# Patient Record
Sex: Female | Born: 1950 | Race: White | Hispanic: No | Marital: Married | Smoking: Never smoker
Health system: Southern US, Community
[De-identification: ages and names within clinical notes are randomized; demographics above are authoritative.]

## PROBLEM LIST (undated history)

## (undated) DIAGNOSIS — N816 Rectocele: Secondary | ICD-10-CM

## (undated) DIAGNOSIS — E079 Disorder of thyroid, unspecified: Secondary | ICD-10-CM

## (undated) HISTORY — PX: PELVIC LAPAROSCOPY: SHX162

## (undated) HISTORY — PX: AUGMENTATION MAMMAPLASTY: SUR837

## (undated) HISTORY — DX: Disorder of thyroid, unspecified: E07.9

## (undated) HISTORY — PX: TUBAL LIGATION: SHX77

## (undated) HISTORY — PX: VAGINAL HYSTERECTOMY: SUR661

## (undated) HISTORY — DX: Rectocele: N81.6

## (undated) HISTORY — PX: SHOULDER SURGERY: SHX246

## (undated) HISTORY — PX: OOPHORECTOMY: SHX86

---

## 1998-03-27 ENCOUNTER — Other Ambulatory Visit: Admission: RE | Admit: 1998-03-27 | Discharge: 1998-03-27 | Payer: Self-pay | Admitting: Obstetrics and Gynecology

## 1999-07-27 ENCOUNTER — Ambulatory Visit (HOSPITAL_COMMUNITY): Admission: RE | Admit: 1999-07-27 | Discharge: 1999-07-27 | Payer: Self-pay | Admitting: Gastroenterology

## 1999-07-29 ENCOUNTER — Other Ambulatory Visit: Admission: RE | Admit: 1999-07-29 | Discharge: 1999-07-29 | Payer: Self-pay | Admitting: Obstetrics and Gynecology

## 2000-09-01 ENCOUNTER — Other Ambulatory Visit: Admission: RE | Admit: 2000-09-01 | Discharge: 2000-09-01 | Payer: Self-pay | Admitting: Obstetrics and Gynecology

## 2000-10-18 ENCOUNTER — Other Ambulatory Visit: Admission: RE | Admit: 2000-10-18 | Discharge: 2000-10-18 | Payer: Self-pay | Admitting: Obstetrics and Gynecology

## 2000-10-18 ENCOUNTER — Encounter (INDEPENDENT_AMBULATORY_CARE_PROVIDER_SITE_OTHER): Payer: Self-pay | Admitting: Specialist

## 2000-11-02 ENCOUNTER — Ambulatory Visit (HOSPITAL_COMMUNITY): Admission: RE | Admit: 2000-11-02 | Discharge: 2000-11-02 | Payer: Self-pay | Admitting: Neurosurgery

## 2000-11-02 ENCOUNTER — Encounter: Payer: Self-pay | Admitting: Neurosurgery

## 2001-03-06 ENCOUNTER — Inpatient Hospital Stay (HOSPITAL_COMMUNITY): Admission: RE | Admit: 2001-03-06 | Discharge: 2001-03-09 | Payer: Self-pay | Admitting: Obstetrics and Gynecology

## 2001-03-06 ENCOUNTER — Encounter (INDEPENDENT_AMBULATORY_CARE_PROVIDER_SITE_OTHER): Payer: Self-pay

## 2002-04-02 ENCOUNTER — Other Ambulatory Visit: Admission: RE | Admit: 2002-04-02 | Discharge: 2002-04-02 | Payer: Self-pay | Admitting: Obstetrics and Gynecology

## 2003-06-13 ENCOUNTER — Other Ambulatory Visit: Admission: RE | Admit: 2003-06-13 | Discharge: 2003-06-13 | Payer: Self-pay | Admitting: Obstetrics and Gynecology

## 2004-07-29 ENCOUNTER — Other Ambulatory Visit: Admission: RE | Admit: 2004-07-29 | Discharge: 2004-07-29 | Payer: Self-pay | Admitting: Obstetrics and Gynecology

## 2004-12-01 ENCOUNTER — Ambulatory Visit (HOSPITAL_BASED_OUTPATIENT_CLINIC_OR_DEPARTMENT_OTHER): Admission: RE | Admit: 2004-12-01 | Discharge: 2004-12-01 | Payer: Self-pay | Admitting: Orthopedic Surgery

## 2005-08-25 ENCOUNTER — Other Ambulatory Visit: Admission: RE | Admit: 2005-08-25 | Discharge: 2005-08-25 | Payer: Self-pay | Admitting: Obstetrics and Gynecology

## 2006-08-30 ENCOUNTER — Other Ambulatory Visit: Admission: RE | Admit: 2006-08-30 | Discharge: 2006-08-30 | Payer: Self-pay | Admitting: Obstetrics and Gynecology

## 2007-09-04 ENCOUNTER — Other Ambulatory Visit: Admission: RE | Admit: 2007-09-04 | Discharge: 2007-09-04 | Payer: Self-pay | Admitting: Obstetrics and Gynecology

## 2008-09-04 ENCOUNTER — Other Ambulatory Visit: Admission: RE | Admit: 2008-09-04 | Discharge: 2008-09-04 | Payer: Self-pay | Admitting: Obstetrics and Gynecology

## 2008-09-04 ENCOUNTER — Encounter: Payer: Self-pay | Admitting: Obstetrics and Gynecology

## 2008-09-04 ENCOUNTER — Ambulatory Visit: Payer: Self-pay | Admitting: Obstetrics and Gynecology

## 2009-06-25 ENCOUNTER — Emergency Department (HOSPITAL_COMMUNITY): Admission: EM | Admit: 2009-06-25 | Discharge: 2009-06-25 | Payer: Self-pay | Admitting: Emergency Medicine

## 2009-06-25 ENCOUNTER — Emergency Department (HOSPITAL_COMMUNITY): Admission: EM | Admit: 2009-06-25 | Discharge: 2009-06-25 | Payer: Self-pay | Admitting: Family Medicine

## 2009-11-25 ENCOUNTER — Ambulatory Visit: Payer: Self-pay | Admitting: Obstetrics and Gynecology

## 2009-11-25 ENCOUNTER — Other Ambulatory Visit: Admission: RE | Admit: 2009-11-25 | Discharge: 2009-11-25 | Payer: Self-pay | Admitting: Obstetrics and Gynecology

## 2010-11-27 ENCOUNTER — Other Ambulatory Visit
Admission: RE | Admit: 2010-11-27 | Discharge: 2010-11-27 | Payer: Self-pay | Source: Home / Self Care | Admitting: Obstetrics and Gynecology

## 2010-11-27 ENCOUNTER — Ambulatory Visit: Payer: Self-pay | Admitting: Obstetrics and Gynecology

## 2011-05-07 NOTE — Op Note (Signed)
[facility]  Patient:    Shawna Mitchell, Shawna Mitchell                      MRN: [id number] Proc. Date: 03/06/01 Attending:  Rande Brunt. Eda Paschal, M.D.                           Operative Report  PREOPERATIVE DIAGNOSES: 1. Dysfunctional uterine bleeding. 2. Leiomyomata uteri. 3. Rectocele. 4. Urinary stress incontinence.  POSTOPERATIVE DIAGNOSES: 1. Dysfunctional uterine bleeding. 2. Leiomyomata uteri. 3. Rectocele. 4. Urinary stress incontinence.  OPERATION: 1. Vaginal hysterectomy. 2. Bilateral salpingo-oophorectomy. 3. Pubovaginal sling. 4. Posterior repair.  SURGEONS:  Daniel L. Eda Paschal, M.D. and Excell Seltzer. Annabell Howells, M.D.  FIRST ASSISTANT:  Juan H. Lily Peer, M.D.  FINDINGS:  At the time of hysterectomy, the patients uterus was enlarged and boggy consistent with adenomyosis.  In addition, there were several myomas in the uterus.  Ovaries and fallopian tubes were free of any disease.  Pelvic peritoneum was free of any disease.  The patient had second degree rectocele.  DESCRIPTION OF PROCEDURE:  After adequate general endotracheal anesthesia, the patient was placed in the dorsolithotomy position and prepped and draped in the usual sterile manner.  A 1:200,000 solution of epinephrine and 0.5% Xylocaine was injected around the cervix.  A 360 degree incision was made around the cervix.  The bladder was mobilized superiorly as was the posterior peritoneum.  The posterior peritoneum and vesicouterine fold of peritoneum were entered by sharp dissection.  The uterosacral ligaments were clamped.  In clamping them, they were shortened, and then they were sutured to the vaginal cuff with good for good vault support.  Cardinal ligaments, uterine arteries, balance of the broad ligaments, utero-ovarian ligaments, round ligaments, and fallopian tubes were successively clamped, cut, and suture ligated.  All major vascular bundles were doubly ligated.  Number 1 chromic  catgut was utilized for all of the above pedicles.  The uterus was delivered and sent to pathology for tissue diagnosis.  During the procedure, in order to deliver the uterus, morcellation and myomectomies were done in order to reduce the size of the uterus to allow it to be removed.  It was, therefore, sent to pathology in pieces.  At this point, the adnexa were identified.  They were delivered into the incision.  The infundibulopelvic ligaments were clamped, cut, and doubly suture ligated with #1 chromic catgut, and both tubes and ovaries were also sent to pathology for tissue diagnosis.  The vaginal cuff was whip stitched to posterior peritoneum with running locking 0 Vicryl.  A modified McCall enterocele suture was placed to eliminate enterocele with 2-0 Vicryl  The peritoneum was then closed with a pursestring 0 Vicryl suture everting the above-mentioned pedicles.  Copious irrigation was done prior to closing the peritoneum.  Once again, two sponge, needle, and instrument counts were correct.  The vaginal cuff was then closed with figure-of-eight of 0 Vicryl.  At this point, Dr. Annabell Howells scrubbed into the procedure and did a pubovaginal sling which he dictated under a separate operative note.  When he was finished, a posterior repair was done by Dr. Eda Paschal.  This was accomplished by opening the posterior vaginal mucosa from the introitus all the way up to the cuff.  The perineum appeared to be well support, so therefore it was not altered in any fashion.  The perirectal fascia was sharply dissected from the mucosa as was the rectocele.  The  surgeon put his finger in the rectum to identify the rectocele even better and finished sharply dissecting it free from the vaginal mucosa.  Once this was done, the rectocele was reduced with interrupted sutures of 2-0 Vicryl incorporating perirectal fascia in order to reduce the rectocele.  Approximately 10 such sutures were utilized.   The posterior vaginal mucosa which had redundancy was trimmed away, and then the posterior vaginal mucosa was closed with a running 2-0 Vicryl.  As it was closed, the perirectal fascia was also picked up to eliminate the dead space.  Estimated blood loss for the entire procedure was 700 cc with none replaced. After the hysterectomy and prior to the pubovaginal sling, a Foley had been placed to empty the bladder to finish closing the vaginal cuff.  This had drained clear urine.  During the sling, indigo carmine was introduced, and both ureteral orifices were noted to be patent, and finally a suprapubic catheter which had been placed by Dr. Annabell Howells was noted to be draining clear urine at the termination of the procedure.  The vagina was then packed with 1 inch Iodoform, and the patient was taken from the operating room in satisfactory condition.DD:  03/06/01 TD:  03/07/01 Job: [id number]

## 2011-05-07 NOTE — Op Note (Signed)
Shawna Mitchell, Shawna Mitchell             ACCOUNT NO.:  [id number]   MEDICAL RECORD NO.:  [id number]          PATIENT TYPE:  AMB   LOCATION:  DSC                          FACILITY:  [facility]   PHYSICIAN:  Robert A. Thurston Hole, M.D. DATE OF BIRTH:  01/16/1951   DATE OF PROCEDURE:  12/01/2004  DATE OF DISCHARGE:                                 OPERATIVE REPORT   PREOPERATIVE DIAGNOSES:  1.  Left shoulder rotator cuff tendinitis with adhesive capsulitis.  2.  Left shoulder partial labrum tear.   POSTOPERATIVE DIAGNOSES:  1.  Left shoulder rotator cuff tendinitis with adhesive capsulitis.  2.  Left shoulder partial labrum tear.   OPERATION PERFORMED:  1.  Left shoulder examination under anesthesia followed by manipulation.  2.  Left shoulder arthroscopy with lysis of adhesions.  3.  Left shoulder arthroscopic partial labrum tear debridement.  4.  Left shoulder subacromial decompression.   SURGEON:  Elana Alm. Thurston Hole, M.D.   ASSISTANT:  Julien Girt, P.A.   ANESTHESIA:  General.   OPERATIVE TIME:  45 minutes.   COMPLICATIONS:  None.   INDICATIONS FOR PROCEDURE:  Shawna Mitchell is a 60 year old woman who has had  2-1/2 months of increased left shoulder pain with exam and MRI documenting  rotator cuff tendinitis with impingement and exam documenting adhesive  capsulitis who has failed conservative care and is now to undergo  examination under anesthesia, manipulation and arthroscopy.   DESCRIPTION OF PROCEDURE:  Shawna Mitchell was brought to the operating room  on December 01, 2004 after an interscalene block had been placed in the  holding room by anesthesia.  She was placed on the operating table in supine  position.  After being placed under general anesthesia, her left shoulder  was examined.  Initial range of motion showed forward flexion of 120,  abduction of 110, internal external rotation of 50 degrees.  A gentle  manipulation was carried out breaking up adhesions and improving  forward  flexion to 175, abduction to 175, internal external rotation of 90 degrees.  The shoulder remained stable to ligamentous exam.  She was then placed in a  beach chair position and her shoulder and arm were prepped using sterile  DuraPrep and draped using sterile technique.  Originally, through a  posterior arthroscopic portal, the arthroscope with a pump attached was  placed and through an anterior portal an arthroscopic probe was placed.  On  initial inspection, the articular cartilage in the glenohumeral joint was  found to be intact.  The anterior labrum showed partial tearing 25% which  was debrided.  Anterior inferior glenohumeral ligament complex was intact.  Superior labrum, biceps tendon anchor was intact.  The biceps tendon was  intact.  Posterior labrum was intact. Rotator cuff on the articular surface  showed no evidence of a tear.  There was diffuse erythematous synovitis  which was debrided and cauterized.  Inferior capsular recess free of  pathology.  Subacromial space was entered and a lateral arthroscopic portal  was made.  A large amount of bursitis was resected.  The rotator cuff was  inflamed and thickened on the bursal  surface but no evidence of a tear.  Impingement was noted and a subacromial decompression was carried out  removing 6 mm of the undersurface of the anterior, anterolateral and  anteromedial acromion, CA ligament release as well.  The [facility] joint was not  disturbed.  Shoulder could be brought through a full range of motion with no  impingement on the rotator cuff after this was done.  At this point it was  felt that all pathology had been satisfactorily addressed.  The instruments  were removed.  Portals were closed with 3-0 nylon suture.  Sterile dressings  and a sling applied and the patient awakened and taken to recovery room in  stable condition.   FOLLOW UP:  [name] will be followed as an outpatient on Percocet and  Naprosyn with early  aggressive physical therapy.  See her back in the office  in a week for suture removal and follow-up.       RAW/MEDQ  D:  12/01/2004  T:  12/01/2004  Job:  [id number]

## 2011-05-07 NOTE — Op Note (Signed)
[facility]  Patient:    Shawna Mitchell, Shawna Mitchell                MRN: [id number] Proc. Date: 03/06/01 Adm. Date:  [id number] Attending:  Sharon Mt CC:         Rande Brunt. Eda Paschal, M.D.   Operative Report  PREOPERATIVE DIAGNOSIS:  Type III stress incontinence.  POSTOPERATIVE DIAGNOSIS:  Type III stress incontinence.  OPERATION:  Pubovaginal sling.  SURGEON:  Excell Seltzer. Annabell Howells, M.D.  ANESTHESIA:  General.  DRAINS:  Suprapubic tube.  COMPLICATIONS:  None.  INDICATIONS:  [name] is a 60 year old white female who is to undergo hysterectomy and posterior repair.  During her preoperative evaluation, she was found to have type III stress incontinence with minimal bladder mobility. It was felt that a sling would be appropriate in conjunction with other procedures.  FINDINGS AND PROCEDURE:  She had been taken to the operating room, placed in lithotomy position, and prepped and draped by Dr. Eda Paschal who had completed a vaginal hysterectomy.  I then entered the procedure where I made a 3 cm transverse incision above the pubis in the midline.  The fat was spread to the fascia with a hemostat.  I then made a midline anterior vaginal incision over the bladder neck and proximal urethra.  The mucosa over the vagina was elevated off the pubourethral fascia on each side to the attachment to the pubis.  Strully scissors were used to pierce the fascia in the retropubic space.  A finger was then passed to ensure an adequate passage.  There was some venous bleeding particularly on the left side.  A single figure-of-eight 2-0 Vicryl stitch was placed in one venous channel to control some bleeding. Once the dissection had been performed, a 2 x 8 cm ______  strip, which had been prepared by fog and soaking in antibiotic solution, was then prepared by placing a #1 Prolene through each end with a quadruple helical stitch.  The suspension sutures were then passed  from the vaginal area to the abdominal incision on each side using two passes of the Raz sliding needle.  The needle was passed under finger guidance from the abdominal incision to the vaginal incision.  Once both of the suspension sutures had been passed, the sling was tacked proximally and distally in the midline using 2-0 Vicryl tacking sutures to ensure good position of the bladder neck and proximal urethra.  The anterior vaginal wall was then closed using a running locked 2-0 Vicryl.  At this point, cystoscopy was performed.  The patient had been given indigo carmine earlier in the procedure.  Inspection of the bladder revealed blue urine effluxing from both ureters freely.  No evidence of bladder injury was noted.  The bladder was then filled.  The patient was placed in Trendelenburg, and a microvasive Fader Tip suprapubic tube was placed through a separate stab wound superior to the abdominal incision.  This was done under cystoscopic guidance.  Once the tube was in good position, the trocar was removed.  The cord was secured, and the tube was placed to straight drainage.  At this point, the bladder was drained.  The cystoscope sheath was left in the urethra to provide a neutral position.  The suspension sutures were then tied over a hemostat to avoid excess tension.  Once they were tied, they were tied across the midline to each other once again avoiding any excess tension.  At this point, the cystoscope sheath was used  to make sure the stitches were done and neutral position was maintained.  The cystoscope sheath was then removed.  The anterior abdominal incision was then closed using a running intracuticular 4-0 Vicryl.  The suprapubic tube was secured in place with a 0 silk suture.  At this point, Dr. Eda Paschal returned to the procedure and performed the posterior repair which will be dictated separately.  There were no complications during my portion of the procedure. DD:   03/06/01 TD:  03/07/01 Job: [id number]

## 2011-05-07 NOTE — Discharge Summary (Signed)
[facility]  Patient:    Shawna Mitchell, Shawna Mitchell                MRN: [id number] Adm. Date:  [id number] Disc. Date: [id number] Attending:  Sharon Mt CC:         Excell Seltzer. Annabell Howells, M.D.   Discharge Summary  HISTORY OF PRESENT ILLNESS:  The patient is a 60 year old, G2, P2 who entered the hospital with severe dysfunctional uterine bleeding, symptomatic rectocele and urinary stress incontinence for definitive surgery.  HOSPITAL COURSE:  On the day of admission, she was taken to the operating room.  She underwent a vaginal hysterectomy with bilateral salpingo-oophorectomy, pubovaginal sling and posterior repair. Postoperatively, she had persistent nausea that did not allow her to tolerate p.o. at all.  Slowly, her diet was advanced as well as continuing to change pain medications and other maneuvers to try and eliminate the nausea.  She was continued on IVs and finally by postop day #3, was now able to take an oral diet in a high enough fashion to be discharged.  FOLLOWUP:  She will be followed as an outpatient both by Dr. Annabell Howells and Dr. Eda Paschal.  DISCHARGE MEDICATIONS: 1. Macrobid to cover her suprapubic catheter which was still in at discharge. 2. Percocet for pain relief. 3. Climara patch 0.5 mg one weekly for estrogen replacement therapy.  DIET:  Regular.  ACTIVITY:  Ambulatory.  LABORATORY DATA AND X-RAY FINDINGS:  Final pathology report revealed a uterus with 3.2 cm fibroid with benign ovary and tubes.  DISCHARGE DIAGNOSES: 1. Dysfunctional uterine bleeding. 2. Leiomyomata uteri. 3. Symptomatic rectocele. 4. Urinary stress incontinence.  PROCEDURE:  Vaginal hysterectomy with bilateral salpingo-oophorectomy and pubovaginal sling with posterior repair.  CONDITION ON DISCHARGE:  Improved.             DD:  03/09/01 TD:  03/09/01 Job: [id number]

## 2011-05-07 NOTE — H&P (Signed)
[facility]  Patient:    Shawna Mitchell, Shawna Mitchell                      MRN: [id number] Attending:  Rande Brunt. Eda Paschal, M.D.                         History and Physical  CHIEF COMPLAINT:  Menometrorrhagia and symptomatic rectocele.  HISTORY OF PRESENT ILLNESS:  The patient is a 60 year old, gravida 2, para 2, abortus 0, who has presented to the office with very heavy periods.  She only has a 14 day period-free cycle, and then its followed by a 5-7 day extremely heavy period with severe menorrhagia.  This is also associated with significant dysmenorrhea.  She has tried oral contraceptives on several occasions to try to regulate this, and she does very poorly with the oral contraceptives, does not feel good, has weight gain, and also really does not find that it has resolved the above.  Sonohysterogram was then performed because of the above, and she has no intracavitary lesions.  She does have an intramural myoma, and it appears that she has adenomyosis based on the echo pattern in the myometrium.  She also has a significant history of endometriosis.  She now enters the hospital for vaginal hysterectomy because of persistent menometrorrhagia nonresponsive to medical therapy, as noted above.  In addition, she would like to have her ovaries removed and would like me to remove them even if it requires laparoscopy or an incision to do so.  In addition, she is also having difficulty eliminating stool.  She has to put perineal pressure in order to get stool out and on pelvic examination, she has a significant rectocele that explains the above.  She therefore will also undergo posterior repair.  She is also having significant urinary stress incontinence, and she will undergo a sling procedure by Dr. Bjorn Pippin at the same time.  PAST MEDICAL HISTORY: 1. Two laparoscopies for endometriosis. 2. Tubal ligation.  PRESENT MEDICATIONS:  Motrin for cramps.  ALLERGIES:   CODEINE.  FAMILY HISTORY:  Parents were both hypertensive.  She has an uncle who has colon cancer.  SOCIAL HISTORY:  She is a nonsmoker, nondrinker.  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  REVIEW OF SYSTEMS:  HEENT:  Negative.  CARDIAC:  Negative. RESPIRATORY:  Negative.  GI:  Negative.  GU:  Negative. NEUROMUSCULAR:  Negative.  ENDOCRINE:  Negative.  PHYSICAL EXAMINATION:  GENERAL:  The patient is a well-developed, well-nourished female in no acute distress.  VITAL SIGNS:  Blood pressure is 116/70, pulse 80 and regular, respirations 16 and nonlabored.  She is afebrile.  HEENT:  All within normal limits.  NECK:  Supple, trachea midline, thyroid not enlarged.  LUNGS:  Clear to P&A.  HEART:  No thrills, heaves, or murmurs.  BREASTS:  She has had augmentation mammoplasty, but there are no masses present.  ABDOMEN:  Soft without guarding, rebound, or masses.  PELVIC:  External and vaginal is within normal limits.  Cervix is clean.  Pap smear shows no atypia.  Uterus is retroverted, is enlarged by small myoma to approximately 7-8 week size.  There is 1-1/2 degrees of uterine prolapse. Adnexa are palpably normal.  Perineum is normal, but patient does have a second-degree rectocele which can be seen even better on rectal exam which is otherwise negative.  ADMISSION IMPRESSION:  Persistent menometrorrhagia nonresponsive to medical therapy, probable adenomyosis  and/or endometriosis, leiomyomata uteri, symptomatic rectocele, urinary stress incontinence.  PLAN:  Vaginal hysterectomy, bilateral salpingo-oophorectomy, pubovaginal sling, and posterior repair. DD:  03/07/01 TD:  03/07/01 Job: [id number]

## 2011-10-07 ENCOUNTER — Encounter: Payer: Self-pay | Admitting: Obstetrics and Gynecology

## 2011-12-03 ENCOUNTER — Other Ambulatory Visit: Payer: Self-pay | Admitting: Obstetrics and Gynecology

## 2011-12-08 ENCOUNTER — Encounter: Payer: Self-pay | Admitting: Gynecology

## 2011-12-08 DIAGNOSIS — R32 Unspecified urinary incontinence: Secondary | ICD-10-CM | POA: Insufficient documentation

## 2011-12-08 DIAGNOSIS — N809 Endometriosis, unspecified: Secondary | ICD-10-CM | POA: Insufficient documentation

## 2011-12-08 DIAGNOSIS — N816 Rectocele: Secondary | ICD-10-CM | POA: Insufficient documentation

## 2011-12-16 ENCOUNTER — Encounter: Payer: Self-pay | Admitting: Obstetrics and Gynecology

## 2011-12-17 ENCOUNTER — Telehealth: Payer: Self-pay | Admitting: *Deleted

## 2011-12-17 MED ORDER — ESTRADIOL 0.05 MG/24HR TD PTWK: 1.0000 | MEDICATED_PATCH | TRANSDERMAL | Status: DC

## 2011-12-17 MED ORDER — LEVOTHYROXINE SODIUM 75 MCG PO TABS: 75.0000 ug | ORAL_TABLET | Freq: Every day | ORAL | Status: DC

## 2011-12-17 NOTE — Telephone Encounter (Signed)
PT CALLED AND HAS SCHEDULED HER ANNUAL ON Jan 09, 2011 PT WOULD LIKE REFILL ON SYTHROID AND CLIMARA PATCH 0.05 MG

## 2012-01-10 ENCOUNTER — Other Ambulatory Visit (HOSPITAL_COMMUNITY)
Admission: RE | Admit: 2012-01-10 | Discharge: 2012-01-10 | Disposition: A | Discharge status: Home / Self Care | Payer: PRIVATE HEALTH INSURANCE | Source: Ambulatory Visit | Attending: Obstetrics and Gynecology | Admitting: Obstetrics and Gynecology

## 2012-01-10 ENCOUNTER — Ambulatory Visit (INDEPENDENT_AMBULATORY_CARE_PROVIDER_SITE_OTHER): Payer: PRIVATE HEALTH INSURANCE | Admitting: Obstetrics and Gynecology

## 2012-01-10 ENCOUNTER — Encounter: Payer: Self-pay | Admitting: Obstetrics and Gynecology

## 2012-01-10 VITALS — BP 112/70 | Ht 66.0 in | Wt 147.0 lb

## 2012-01-10 DIAGNOSIS — Z01419 Encounter for gynecological examination (general) (routine) without abnormal findings: Secondary | ICD-10-CM

## 2012-01-10 DIAGNOSIS — Z780 Asymptomatic menopausal state: Secondary | ICD-10-CM

## 2012-01-10 DIAGNOSIS — N951 Menopausal and female climacteric states: Secondary | ICD-10-CM

## 2012-01-10 DIAGNOSIS — E039 Hypothyroidism, unspecified: Secondary | ICD-10-CM

## 2012-01-10 LAB — URINALYSIS W MICROSCOPIC + REFLEX CULTURE
Casts: NONE SEEN
Crystals: NONE SEEN
Glucose, UA: NEGATIVE mg/dL
Protein, ur: NEGATIVE mg/dL

## 2012-01-10 LAB — CBC WITH DIFFERENTIAL/PLATELET
Eosinophils Absolute: 0.1 10*3/uL (ref 0.0–0.7)
Eosinophils Relative: 1 % (ref 0–5)
HCT: 41.5 % (ref 36.0–46.0)
Hemoglobin: 13.9 g/dL (ref 12.0–15.0)
Lymphs Abs: 1.8 10*3/uL (ref 0.7–4.0)
MCH: 31 pg (ref 26.0–34.0)
MCV: 92.4 fL (ref 78.0–100.0)
Monocytes Absolute: 0.5 10*3/uL (ref 0.1–1.0)
Monocytes Relative: 8 % (ref 3–12)
RBC: 4.49 MIL/uL (ref 3.87–5.11)

## 2012-01-10 NOTE — Progress Notes (Signed)
Patient came to see me today for her annual GYN exam. She has noticed for the last 3 months that she has night sweats, sleep disturbance, and migraines which have not bothered her for years. She used to go to the headache clinic but has not been a long time. She is up-to-date on mammograms. She's had 2 normal bone densities. She is having no vaginal bleeding. She has no pelvic pain. She remains on the same dose of both estrogen and thyroid.  Physical examination: Shawna Mitchell present. HEENT within normal limits. Neck: Thyroid not large. No masses. Supraclavicular nodes: not enlarged. Breasts: Examined in both sitting midline position. No skin changes and no masses. Abdomen: Soft no guarding rebound or masses or hernia. Pelvic: External: Within normal limits. BUS: Within normal limits. Vaginal:within normal limits. Good estrogen effect. No evidence of cystocele rectocele or enterocele. Cervix and uterus surgically absent. Adnexa: No masses. Rectovaginal exam: Confirmatory and negative. Extremities: Within normal limits.  Assessment: New symptoms suggestive of an adequate hormone replacement. Hypothyroidism.  Plan: TSH and estradiol drawn. We will call patient with results. We will need to e- prescribed medication once we see lab.

## 2012-01-11 ENCOUNTER — Other Ambulatory Visit: Payer: Self-pay | Admitting: *Deleted

## 2012-01-11 LAB — ESTRADIOL: Estradiol: 64.5 pg/mL

## 2012-01-11 MED ORDER — ESTRADIOL 0.05 MG/24HR TD PTWK: 1.0000 | MEDICATED_PATCH | TRANSDERMAL | Status: DC

## 2012-01-11 MED ORDER — ESCITALOPRAM OXALATE 10 MG PO TABS: ORAL_TABLET | ORAL | Status: DC

## 2012-01-11 MED ORDER — LEVOTHYROXINE SODIUM 75 MCG PO TABS: 75.0000 ug | ORAL_TABLET | Freq: Every day | ORAL | Status: DC

## 2012-01-21 ENCOUNTER — Telehealth: Payer: Self-pay | Admitting: *Deleted

## 2012-01-21 MED ORDER — ESTRADIOL 0.075 MG/24HR TD PTWK: 1.0000 | MEDICATED_PATCH | TRANSDERMAL | Status: DC

## 2012-01-21 NOTE — Telephone Encounter (Signed)
Pt called and said pharmacy never got her climara 0.075 patch. Per result note on 01/10/12 pt should be taking 0.075 not 0.05. New rx sent to pharmacy.

## 2013-01-10 ENCOUNTER — Ambulatory Visit (INDEPENDENT_AMBULATORY_CARE_PROVIDER_SITE_OTHER): Payer: PRIVATE HEALTH INSURANCE | Admitting: Gynecology

## 2013-01-10 ENCOUNTER — Encounter: Payer: Self-pay | Admitting: Gynecology

## 2013-01-10 VITALS — BP 130/80 | Ht 66.0 in | Wt 148.0 lb

## 2013-01-10 DIAGNOSIS — F411 Generalized anxiety disorder: Secondary | ICD-10-CM

## 2013-01-10 DIAGNOSIS — Z01419 Encounter for gynecological examination (general) (routine) without abnormal findings: Secondary | ICD-10-CM

## 2013-01-10 DIAGNOSIS — Z23 Encounter for immunization: Secondary | ICD-10-CM

## 2013-01-10 DIAGNOSIS — F419 Anxiety disorder, unspecified: Secondary | ICD-10-CM

## 2013-01-10 DIAGNOSIS — N952 Postmenopausal atrophic vaginitis: Secondary | ICD-10-CM | POA: Insufficient documentation

## 2013-01-10 DIAGNOSIS — E039 Hypothyroidism, unspecified: Secondary | ICD-10-CM

## 2013-01-10 DIAGNOSIS — IMO0002 Reserved for concepts with insufficient information to code with codable children: Secondary | ICD-10-CM

## 2013-01-10 DIAGNOSIS — F329 Major depressive disorder, single episode, unspecified: Secondary | ICD-10-CM

## 2013-01-10 LAB — HEMOGLOBIN A1C
Hgb A1c MFr Bld: 5.6 % (ref <5.7)
Mean Plasma Glucose: 114 mg/dL (ref <117)

## 2013-01-10 LAB — CBC WITH DIFFERENTIAL/PLATELET
Basophils Absolute: 0.1 10*3/uL (ref 0.0–0.1)
HCT: 40.3 % (ref 36.0–46.0)
Lymphocytes Relative: 27 % (ref 12–46)
Monocytes Absolute: 0.4 10*3/uL (ref 0.1–1.0)
Neutro Abs: 2.8 10*3/uL (ref 1.7–7.7)
Neutrophils Relative %: 61 % (ref 43–77)
RDW: 13.5 % (ref 11.5–15.5)
WBC: 4.5 10*3/uL (ref 4.0–10.5)

## 2013-01-10 LAB — CHOLESTEROL, TOTAL: Cholesterol: 194 mg/dL (ref 0–200)

## 2013-01-10 MED ORDER — OSPEMIFENE 60 MG PO TABS: 60.0000 mg | ORAL_TABLET | Freq: Every day | ORAL | Status: DC

## 2013-01-10 MED ORDER — ESCITALOPRAM OXALATE 10 MG PO TABS: ORAL_TABLET | ORAL | Status: DC

## 2013-01-10 NOTE — Patient Instructions (Addendum)
Tetanus, Diphtheria, Pertussis (Tdap) Vaccine What You Need to Know WHY GET VACCINATED? Tetanus, diphtheria and pertussis can be very serious diseases, even for adolescents and adults. Tdap vaccine can protect us from these diseases. TETANUS (Lockjaw) causes painful muscle tightening and stiffness, usually all over the body.  It can lead to tightening of muscles in the head and neck so you can't open your mouth, swallow, or sometimes even breathe. Tetanus kills about 1 out of 5 people who are infected. DIPHTHERIA can cause a thick coating to form in the back of the throat.  It can lead to breathing problems, paralysis, heart failure, and death. PERTUSSIS (Whooping Cough) causes severe coughing spells, which can cause difficulty breathing, vomiting and disturbed sleep.  It can also lead to weight loss, incontinence, and rib fractures. Up to 2 in 100 adolescents and 5 in 100 adults with pertussis are hospitalized or have complications, which could include pneumonia and death. These diseases are caused by bacteria. Diphtheria and pertussis are spread from person to person through coughing or sneezing. Tetanus enters the body through cuts, scratches, or wounds. Before vaccines, the [place] saw as many as 200,000 cases a year of diphtheria and pertussis, and hundreds of cases of tetanus. Since vaccination began, tetanus and diphtheria have dropped by about 99% and pertussis by about 80%. TDAP VACCINE Tdap vaccine can protect adolescents and adults from tetanus, diphtheria, and pertussis. One dose of Tdap is routinely given at age 11 or 12. People who did not get Tdap at that age should get it as soon as possible. Tdap is especially important for health care professionals and anyone having close contact with a baby younger than 12 months. Pregnant women should get a dose of Tdap during every pregnancy, to protect the newborn from pertussis. Infants are most at risk for severe, life-threatening  complications from pertussis. A similar vaccine, called Td, protects from tetanus and diphtheria, but not pertussis. A Td booster should be given every 10 years. Tdap may be given as one of these boosters if you have not already gotten a dose. Tdap may also be given after a severe cut or burn to prevent tetanus infection. Your doctor can give you more information. Tdap may safely be given at the same time as other vaccines. SOME PEOPLE SHOULD NOT GET THIS VACCINE  If you ever had a life-threatening allergic reaction after a dose of any tetanus, diphtheria, or pertussis containing vaccine, OR if you have a severe allergy to any part of this vaccine, you should not get Tdap. Tell your doctor if you have any severe allergies.  If you had a coma, or long or multiple seizures within 7 days after a childhood dose of DTP or DTaP, you should not get Tdap, unless a cause other than the vaccine was found. You can still get Td.  Talk to your doctor if you:  have epilepsy or another nervous system problem,  had severe pain or swelling after any vaccine containing diphtheria, tetanus or pertussis,  ever had Guillain-Barr Syndrome (GBS),  aren't feeling well on the day the shot is scheduled. RISKS OF A VACCINE REACTION With any medicine, including vaccines, there is a chance of side effects. These are usually mild and go away on their own, but serious reactions are also possible. Brief fainting spells can follow a vaccination, leading to injuries from falling. Sitting or lying down for about 15 minutes can help prevent these. Tell your doctor if you feel dizzy or light-headed, or   have vision changes or ringing in the ears. Mild problems following Tdap (Did not interfere with activities)  Pain where the shot was given (about 3 in 4 adolescents or 2 in 3 adults)  Redness or swelling where the shot was given (about 1 person in 5)  Mild fever of at least 100.4F (up to about 1 in 25 adolescents or 1 in  100 adults)  Headache (about 3 or 4 people in 10)  Tiredness (about 1 person in 3 or 4)  Nausea, vomiting, diarrhea, stomach ache (up to 1 in 4 adolescents or 1 in 10 adults)  Chills, body aches, sore joints, rash, swollen glands (uncommon) Moderate problems following Tdap (Interfered with activities, but did not require medical attention)  Pain where the shot was given (about 1 in 5 adolescents or 1 in 100 adults)  Redness or swelling where the shot was given (up to about 1 in 16 adolescents or 1 in 25 adults)  Fever over 102F (about 1 in 100 adolescents or 1 in 250 adults)  Headache (about 3 in 20 adolescents or 1 in 10 adults)  Nausea, vomiting, diarrhea, stomach ache (up to 1 or 3 people in 100)  Swelling of the entire arm where the shot was given (up to about 3 in 100). Severe problems following Tdap (Unable to perform usual activities, required medical attention)  Swelling, severe pain, bleeding and redness in the arm where the shot was given (rare). A severe allergic reaction could occur after any vaccine (estimated less than 1 in a million doses). WHAT IF THERE IS A SERIOUS REACTION? What should I look for?  Look for anything that concerns you, such as signs of a severe allergic reaction, very high fever, or behavior changes. Signs of a severe allergic reaction can include hives, swelling of the face and throat, difficulty breathing, a fast heartbeat, dizziness, and weakness. These would start a few minutes to a few hours after the vaccination. What should I do?  If you think it is a severe allergic reaction or other emergency that can't wait, call 9-1-1 or get the person to the nearest hospital. Otherwise, call your doctor.  Afterward, the reaction should be reported to the "Vaccine Adverse Event Reporting System" (VAERS). Your doctor might file this report, or you can do it yourself through the VAERS web site at www.vaers.hhs.gov, or by calling 1-800-822-7967. VAERS is  only for reporting reactions. They do not give medical advice.  THE NATIONAL VACCINE INJURY COMPENSATION PROGRAM The National Vaccine Injury Compensation Program (VICP) is a federal program that was created to compensate people who may have been injured by certain vaccines. Persons who believe they may have been injured by a vaccine can learn about the program and about filing a claim by calling 1-800-338-2382 or visiting the VICP website at www.hrsa.gov/vaccinecompensation. HOW CAN I LEARN MORE?  Ask your doctor.  Call your local or state health department.  Contact the Centers for Disease Control and Prevention (CDC):  Call 1-800-232-4636 or visit CDC's website at www.cdc.gov/vaccines CDC Tdap Vaccine VIS (04/27/12) Document Released: 06/06/2012 Document Reviewed: 06/06/2012 ExitCare Patient Information 2013 [employer].  Shingles Vaccine What You Need to Know WHAT IS SHINGLES?  Shingles is a painful skin rash, often with blisters. It is also called Herpes Zoster or just Zoster.  A shingles rash usually appears on one side of the face or body and lasts from 2 to 4 weeks. Its main symptom is pain, which can be quite severe. Other symptoms of   shingles can include fever, headache, chills, and upset stomach. Very rarely, a shingles infection can lead to pneumonia, hearing problems, blindness, brain inflammation (encephalitis), or death.  For about 1 person in 5, severe pain can continue even after the rash clears up. This is called post-herpetic neuralgia.  Shingles is caused by the Varicella Zoster virus. This is the same virus that causes chickenpox. Only someone who has had a case of chickenpox or rarely, has gotten chickenpox vaccine, can get shingles. The virus stays in your body. It can reappear many years later to cause a case of shingles.  You cannot catch shingles from another person with shingles. However, a person who has never had chickenpox (or chickenpox vaccine) could get  chickenpox from someone with shingles. This is not very common.  Shingles is far more common in people 50 and older than in younger people. It is also more common in people whose immune systems are weakened because of a disease such as cancer or drugs such as steroids or chemotherapy.  At least 1 million people get shingles per year in the [place]. SHINGLES VACCINE  A vaccine for shingles was licensed in 2006. In clinical trials, the vaccine reduced the risk of shingles by 50%. It can also reduce the pain in people who still get shingles after being vaccinated.  A single dose of shingles vaccine is recommended for adults 60 years of age and older. SOME PEOPLE SHOULD NOT GET SHINGLES VACCINE OR SHOULD WAIT A person should not get shingles vaccine if he or she:  Has ever had a life-threatening allergic reaction to gelatin, the antibiotic neomycin, or any other component of shingles vaccine. Tell your caregiver if you have any severe allergies.  Has a weakened immune system because of current:  AIDS or another disease that affects the immune system.  Treatment with drugs that affect the immune system, such as prolonged use of high-dose steroids.  Cancer treatment, such as radiation or chemotherapy.  Cancer affecting the bone marrow or lymphatic system, such as leukemia or lymphoma.  Is pregnant, or might be pregnant. Women should not become pregnant until at least 4 weeks after getting shingles vaccine. Someone with a minor illness, such as a cold, may be vaccinated. Anyone with a moderate or severe acute illness should usually wait until he or she recovers before getting the vaccine. This includes anyone with a temperature of 101.3 F (38 C) or higher. WHAT ARE THE RISKS FROM SHINGLES VACCINE?  A vaccine, like any medicine, could possibly cause serious problems, such as severe allergic reactions. However, the risk of a vaccine causing serious harm, or death, is extremely  small.  No serious problems have been identified with shingles vaccine. Mild Problems  Redness, soreness, swelling, or itching at the site of the injection (about 1 person in 3).  Headache (about 1 person in 70). Like all vaccines, shingles vaccine is being closely monitored for unusual or severe problems. WHAT IF THERE IS A MODERATE OR SEVERE REACTION? What should I look for? Any unusual condition, such as a severe allergic reaction or a high fever. If a severe allergic reaction occurred, it would be within a few minutes to an hour after the shot. Signs of a serious allergic reaction can include difficulty breathing, weakness, hoarseness or wheezing, a fast heartbeat, hives, dizziness, paleness, or swelling of the throat. What should I do?  Call your caregiver, or get the person to a caregiver right away.  Tell the caregiver what happened,   the date and time it happened, and when the vaccination was given.  Ask the caregiver to report the reaction by filing a Vaccine Adverse Event Reporting System (VAERS) form. Or, you can file this report through the VAERS web site at www.vaers.hhs.gov or by calling 1-800-822-7967. VAERS does not provide medical advice. HOW CAN I LEARN MORE?  Ask your caregiver. He or she can give you the vaccine package insert or suggest other sources of information.  Contact the Centers for Disease Control and Prevention (CDC):  Call 1-800-232-4636 (1-800-CDC-INFO).  Visit the CDC website at www.cdc.gov/vaccines CDC Shingles Vaccine VIS (09/24/08) Document Released: 10/03/2006 Document Revised: 02/28/2012 Document Reviewed: 09/24/2008 ExitCare Patient Information 2013 [employer].   

## 2013-01-10 NOTE — Progress Notes (Signed)
Shawna Mitchell Feb 03, 1951 [id number]   History:    62 y.o.  for annual gyn exam who has a history of hypothyroidism as well as past history of depression and anxiety. Also review of her record indicated that she has been on ERT consisting of Climara 0.07 04/24/2001 when patient had a TVH BSO sling procedure. Her main concern is vaginal dryness which she's off ERT. Patient's last mammogram was in favor 2013 schedule for one this year. Her last bone density study done at another facility 4 years ago was normal. Her last colonoscopy was normal in 2012. She has an uncle had colon cancer. She's currently taking calcium and vitamin D for osteoporosis prevention. She rarely sees her primary care physician at [facility].  Past medical history,surgical history, family history and social history were all reviewed and documented in the EPIC chart.  Gynecologic History No LMP recorded. Patient has had a hysterectomy. Contraception: status post hysterectomy Last Pap: 2013. Results were: normal Last mammogram: 2013. Results were: normal  Obstetric History OB History    Grav Para Term Preterm Abortions TAB SAB Ect Mult Living   2 2 2      1 3      # Outc Date GA Lbr Len/2nd Wgt Sex Del Anes PTL Lv   1 TRM            2 TRM                ROS: A ROS was performed and pertinent positives and negatives are included in the history.  GENERAL: No fevers or chills. HEENT: No change in vision, no earache, sore throat or sinus congestion. NECK: No pain or stiffness. CARDIOVASCULAR: No chest pain or pressure. No palpitations. PULMONARY: No shortness of breath, cough or wheeze. GASTROINTESTINAL: No abdominal pain, nausea, vomiting or diarrhea, melena or bright red blood per rectum. GENITOURINARY: No urinary frequency, urgency, hesitancy or dysuria. MUSCULOSKELETAL: No joint or muscle pain, no back pain, no recent trauma. DERMATOLOGIC: No rash, no itching, no lesions. ENDOCRINE: No polyuria, polydipsia,  no heat or cold intolerance. No recent change in weight. HEMATOLOGICAL: No anemia or easy bruising or bleeding. NEUROLOGIC: No headache, seizures, numbness, tingling or weakness. PSYCHIATRIC: No depression, no loss of interest in normal activity or change in sleep pattern.     Exam: chaperone present  BP 130/80  Ht 5\' 6"  (1.676 m)  Wt 148 lb (67.132 kg)  BMI 23.89 kg/m2  Body mass index is 23.89 kg/(m^2).  General appearance : Well developed well nourished female. No acute distress HEENT: Neck supple, trachea midline, no carotid bruits, no thyroidmegaly Lungs: Clear to auscultation, no rhonchi or wheezes, or rib retractions  Heart: Regular rate and rhythm, no murmurs or gallops Breast:Examined in sitting and supine position were symmetrical in appearance, no palpable masses or tenderness,  no skin retraction, no nipple inversion, no nipple discharge, no skin discoloration, no axillary or supraclavicular lymphadenopathy Abdomen: no palpable masses or tenderness, no rebound or guarding Extremities: no edema or skin discoloration or tenderness  Pelvic:  Bartholin, Urethra, Skene Glands: Within normal limits             Vagina: No gross lesions or discharge  Cervix: Absent  Uterus absent  Adnexa  Without masses or tenderness  Anus and perineum  normal   Rectovaginal  normal sphincter tone without palpated masses or tenderness             Hemoccult cards provided   Patient states  that she's had her flu vaccine several months ago. She does not recall ever having received a Tdap vaccine. She has not had her shingles vaccine yet.  Assessment/Plan:  62 y.o. female for annual exam who was counseled and received a Tdap vaccine today. Prescription was provided for her to obtain her shingles vaccine. She was reminded to continue to do her monthly self breast examination and to followup with her mammogram next month. Hemoccult cards were provided for her to submit to the office for testing. We  discussed the new Pap smear screening guidelines. Patient with no past history of abnormal Pap smears. Patient will no longer needs Pap smears since she has had a hysterectomy. She will be schedule her bone density study in the next few months. We discussed importance of calcium and vitamin D and regular exercise for osteoporosis prevention. She was given prescription refill her Lexapro 10 mg which she takes daily as a mood stabilizer her depression and anxiety. Also prescription refill was provided for her Synthroid 75 mcg daily. We discussed today the women's health initiative study and the recommendation to taper off her estrogen replacement therapy since she has been on it since 2002. We discussed for her vaginal atrophy and dyspareunia the new product Osphena for which the risk benefits and pros and cons were discussed. We'll start her on 60 mg daily once she comes off the estrogen. Literature information was provided. The following labs were today: CBC, screening cholesterol, hemoglobin A1c, TSH and urinalysis.    Ok Edwards MD, 9:06 AM 01/10/2013

## 2013-01-11 LAB — URINALYSIS W MICROSCOPIC + REFLEX CULTURE
Bacteria, UA: NONE SEEN
Hgb urine dipstick: NEGATIVE
Nitrite: NEGATIVE
Protein, ur: NEGATIVE mg/dL
Urobilinogen, UA: 0.2 mg/dL (ref 0.0–1.0)
pH: 5 (ref 5.0–8.0)

## 2013-01-12 LAB — URINE CULTURE: Colony Count: 25000

## 2013-02-17 ENCOUNTER — Other Ambulatory Visit: Payer: Self-pay | Admitting: Women's Health

## 2013-04-03 ENCOUNTER — Telehealth: Payer: Self-pay | Admitting: *Deleted

## 2013-04-03 MED ORDER — ESTRADIOL 0.075 MG/24HR TD PTWK: 1.0000 | MEDICATED_PATCH | TRANSDERMAL | Status: DC

## 2013-04-03 NOTE — Telephone Encounter (Signed)
Pt informed with the below note, rx sent. 

## 2013-04-03 NOTE — Telephone Encounter (Signed)
Pt called stating she had to stop taking the osphena 60 mg due to severe leg cramps, the leg cramps did stop once off medication. But she does have very bad hot flashes day and night very much worse at bedtime to the point it wakes her out of her sleep, pt is getting only about 3 hours per night of sleep. She asked if Rx could be giving for hot flashes. Please advise

## 2013-04-03 NOTE — Telephone Encounter (Signed)
Week and call her in the Climara 0.075 weekly transdermal patch and that she was on in the past. Prescribe  4  with 11 refills

## 2013-04-03 NOTE — Telephone Encounter (Signed)
Left message for pt to call.

## 2013-08-25 ENCOUNTER — Ambulatory Visit (INDEPENDENT_AMBULATORY_CARE_PROVIDER_SITE_OTHER): Payer: PRIVATE HEALTH INSURANCE | Admitting: Family Medicine

## 2013-08-25 VITALS — BP 118/70 | HR 72 | Temp 98.2°F | Resp 16 | Ht 66.5 in | Wt 142.0 lb

## 2013-08-25 DIAGNOSIS — G43909 Migraine, unspecified, not intractable, without status migrainosus: Secondary | ICD-10-CM

## 2013-08-25 MED ORDER — KETOROLAC TROMETHAMINE 60 MG/2ML IM SOLN
60.0000 mg | Freq: Once | INTRAMUSCULAR | Status: AC
  Administered 2013-08-25: 60 mg via INTRAMUSCULAR

## 2013-08-25 MED ORDER — BUTALBITAL-APAP-CAFFEINE 50-325-40 MG PO TABS: 1.0000 | ORAL_TABLET | Freq: Four times a day (QID) | ORAL | Status: AC | PRN

## 2013-08-25 MED ORDER — PROMETHAZINE HCL 25 MG/ML IJ SOLN
25.0000 mg | Freq: Once | INTRAMUSCULAR | Status: AC
  Administered 2013-08-25: 25 mg via INTRAMUSCULAR

## 2013-08-25 NOTE — Patient Instructions (Signed)
Migraine Headache A migraine headache is an intense, throbbing pain on one or both sides of your head. A migraine can last for 30 minutes to several hours. CAUSES  The exact cause of a migraine headache is not always known. However, a migraine may be caused when nerves in the brain become irritated and release chemicals that cause inflammation. This causes pain. SYMPTOMS  Pain on one or both sides of your head.  Pulsating or throbbing pain.  Severe pain that prevents daily activities.  Pain that is aggravated by any physical activity.  Nausea, vomiting, or both.  Dizziness.  Pain with exposure to bright lights, loud noises, or activity.  General sensitivity to bright lights, loud noises, or smells. Before you get a migraine, you may get warning signs that a migraine is coming (aura). An aura may include:  Seeing flashing lights.  Seeing bright spots, halos, or zig-zag lines.  Having tunnel vision or blurred vision.  Having feelings of numbness or tingling.  Having trouble talking.  Having muscle weakness. MIGRAINE TRIGGERS  Alcohol.  Smoking.  Stress.  Menstruation.  Aged cheeses.  Foods or drinks that contain nitrates, glutamate, aspartame, or tyramine.  Lack of sleep.  Chocolate.  Caffeine.  Hunger.  Physical exertion.  Fatigue.  Medicines used to treat chest pain (nitroglycerine), birth control pills, estrogen, and some blood pressure medicines. DIAGNOSIS  A migraine headache is often diagnosed based on:  Symptoms.  Physical examination.  A CT scan or MRI of your head. TREATMENT Medicines may be given for pain and nausea. Medicines can also be given to help prevent recurrent migraines.  HOME CARE INSTRUCTIONS  Only take over-the-counter or prescription medicines for pain or discomfort as directed by your caregiver. The use of long-term narcotics is not recommended.  Lie down in a dark, quiet room when you have a migraine.  Keep a journal  to find out what may trigger your migraine headaches. For example, write down:  What you eat and drink.  How much sleep you get.  Any change to your diet or medicines.  Limit alcohol consumption.  Quit smoking if you smoke.  Get 7 to 9 hours of sleep, or as recommended by your caregiver.  Limit stress.  Keep lights dim if bright lights bother you and make your migraines worse. SEEK IMMEDIATE MEDICAL CARE IF:   Your migraine becomes severe.  You have a fever.  You have a stiff neck.  You have vision loss.  You have muscular weakness or loss of muscle control.  You start losing your balance or have trouble walking.  You feel faint or pass out.  You have severe symptoms that are different from your first symptoms. MAKE SURE YOU:   Understand these instructions.  Will watch your condition.  Will get help right away if you are not doing well or get worse. Document Released: 12/06/2005 Document Revised: 02/28/2012 Document Reviewed: 11/26/2011 Exit[employer] Patient Information 2014 [employer].  

## 2013-08-25 NOTE — Progress Notes (Signed)
Subjective:    Patient ID: Shawna Mitchell, female    DOB: 1951-12-17, 63 y.o.   MRN: [id number] Chief Complaint  Patient presents with  . Migraine    since 2 am today; nausea, vomiting, light and sound sensitivity    HPI  Has long h/o migraines but has not had severe migraine since she has become post-menopausal - in over 10 yrs.  Occ has mild migraines. If she takes her excedrin migraine it usually knocks it out.  Her right forehead is painful throbbing and radiating back.  No vision changes.  NO sinus pressure, no watering eyes or nose. No dizziness. No f/c. Severe photophobia. Last took excedrin migraine about 8 hrs ago with no relief. Has just gotten worse and is worried she isn't going to be able to sleep tonight. This is the worst one she has had in a very long time but is NOT the worst HA of her life.  Has never needed to use triptans prior.  Past Medical History  Diagnosis Date  . Endometriosis   . Rectocele   . Urinary incontinence     USI  . Thyroid disease     Hypothyroid   Current Outpatient Prescriptions on File Prior to Visit  Medication Sig Dispense Refill  . Calcium Carbonate-Vitamin D (CALCIUM + D PO) Take by mouth.        . estradiol (CLIMARA - DOSED IN MG/24 HR) 0.075 mg/24hr Place 1 patch (0.075 mg total) onto the skin once a week.  4 patch  11  . levothyroxine (SYNTHROID, LEVOTHROID) 75 MCG tablet TAKE 1 TABLET BY MOUTH EVERY DAY  30 tablet  12  . escitalopram (LEXAPRO) 10 MG tablet Take 10 mg by mouth daily.        Marland Kitchen escitalopram (LEXAPRO) 10 MG tablet Take one every other day  30 tablet  6  . Ospemifene (OSPHENA) 60 MG TABS Take 60 mg by mouth daily.  30 tablet  11   No current facility-administered medications on file prior to visit.   Allergies  Allergen Reactions  . Codeine      Review of Systems  Constitutional: Positive for fatigue. Negative for fever, chills, diaphoresis, appetite change and unexpected weight change.  HENT: Negative for ear  pain, congestion, rhinorrhea, neck pain, neck stiffness, dental problem, sinus pressure and tinnitus.   Eyes: Positive for photophobia. Negative for pain, discharge and visual disturbance.  Gastrointestinal: Negative for nausea and vomiting.  Skin: Negative for rash.  Neurological: Positive for headaches. Negative for dizziness, tremors, seizures, syncope, facial asymmetry, speech difficulty, weakness, light-headedness and numbness.  Psychiatric/Behavioral: Negative for sleep disturbance.      BP 118/70  Pulse 72  Temp(Src) 98.2 F (36.8 C) (Oral)  Resp 16  Ht 5' 6.5" (1.689 m)  Wt 142 lb (64.411 kg)  BMI 22.58 kg/m2  SpO2 98%  Objective:   Physical Exam  Constitutional: She is oriented to person, place, and time. She appears well-developed and well-nourished. She appears listless.  Laying on exam table in dark room, all lights off, crying.  HENT:  Head: Normocephalic and atraumatic.  Right Ear: Tympanic membrane, external ear and ear canal normal.  Left Ear: Tympanic membrane, external ear and ear canal normal.  Nose: Nose normal. Right sinus exhibits no maxillary sinus tenderness. Left sinus exhibits no maxillary sinus tenderness.  Mouth/Throat: Uvula is midline, oropharynx is clear and moist and mucous membranes are normal. No oropharyngeal exudate.  Eyes: Conjunctivae and EOM are normal. Pupils are  equal, round, and reactive to light.  Neck: Normal range of motion. Neck supple. No thyromegaly present.  Cardiovascular: Normal rate, regular rhythm, normal heart sounds and intact distal pulses.   Pulmonary/Chest: Effort normal and breath sounds normal. No respiratory distress.  Neurological: She is oriented to person, place, and time. She has normal strength. She appears listless. No cranial nerve deficit or sensory deficit. Coordination and gait normal.  Skin: Skin is warm and dry. She is not diaphoretic.  Psychiatric: She has a normal mood and affect. Her behavior is normal.       Assessment & Plan:  Migraine - Plan: ketorolac (TORADOL) injection 60 mg, promethazine (PHENERGAN) injection 25 mg If recurs or becomes more freq, RTC to discuss. If HA persists or worsens - to ER. Meds ordered this encounter  Medications  . ketorolac (TORADOL) injection 60 mg    Sig:   . promethazine (PHENERGAN) injection 25 mg    Sig:   . butalbital-acetaminophen-caffeine (FIORICET) 50-325-40 MG per tablet    Sig: Take 1-2 tablets by mouth every 6 (six) hours as needed for headache.    Dispense:  10 tablet    Refill:  0

## 2013-10-11 ENCOUNTER — Encounter: Payer: Self-pay | Admitting: Gynecology

## 2014-01-20 HISTORY — PX: SHOULDER OPEN ROTATOR CUFF REPAIR: SHX2407

## 2014-02-03 ENCOUNTER — Emergency Department (HOSPITAL_BASED_OUTPATIENT_CLINIC_OR_DEPARTMENT_OTHER)
Admission: EM | Admit: 2014-02-03 | Discharge: 2014-02-03 | Disposition: A | Discharge status: Home / Self Care | Payer: PRIVATE HEALTH INSURANCE | Attending: Emergency Medicine | Admitting: Emergency Medicine

## 2014-02-03 ENCOUNTER — Encounter (HOSPITAL_BASED_OUTPATIENT_CLINIC_OR_DEPARTMENT_OTHER): Payer: Self-pay | Admitting: Emergency Medicine

## 2014-02-03 ENCOUNTER — Emergency Department (HOSPITAL_BASED_OUTPATIENT_CLINIC_OR_DEPARTMENT_OTHER): Payer: PRIVATE HEALTH INSURANCE

## 2014-02-03 DIAGNOSIS — S6000XA Contusion of unspecified finger without damage to nail, initial encounter: Secondary | ICD-10-CM | POA: Insufficient documentation

## 2014-02-03 DIAGNOSIS — Y939 Activity, unspecified: Secondary | ICD-10-CM | POA: Insufficient documentation

## 2014-02-03 DIAGNOSIS — Z79899 Other long term (current) drug therapy: Secondary | ICD-10-CM | POA: Insufficient documentation

## 2014-02-03 DIAGNOSIS — S60112A Contusion of left thumb with damage to nail, initial encounter: Secondary | ICD-10-CM

## 2014-02-03 DIAGNOSIS — Y929 Unspecified place or not applicable: Secondary | ICD-10-CM | POA: Insufficient documentation

## 2014-02-03 DIAGNOSIS — T148XXA Other injury of unspecified body region, initial encounter: Secondary | ICD-10-CM

## 2014-02-03 DIAGNOSIS — X58XXXA Exposure to other specified factors, initial encounter: Secondary | ICD-10-CM | POA: Insufficient documentation

## 2014-02-03 DIAGNOSIS — E039 Hypothyroidism, unspecified: Secondary | ICD-10-CM | POA: Insufficient documentation

## 2014-02-03 DIAGNOSIS — Z8742 Personal history of other diseases of the female genital tract: Secondary | ICD-10-CM | POA: Insufficient documentation

## 2014-02-03 MED ORDER — OXYCODONE-ACETAMINOPHEN 5-325 MG PO TABS
1.0000 | ORAL_TABLET | Freq: Once | ORAL | Status: AC
  Administered 2014-02-03: 1 via ORAL
  Filled 2014-02-03: qty 1

## 2014-02-03 MED ORDER — DOXYCYCLINE HYCLATE 100 MG PO CAPS: 100.0000 mg | ORAL_CAPSULE | Freq: Two times a day (BID) | ORAL | Status: DC

## 2014-02-03 MED ORDER — PROMETHAZINE HCL 25 MG PO TABS
12.5000 mg | ORAL_TABLET | Freq: Once | ORAL | Status: AC
  Administered 2014-02-03: 12.5 mg via ORAL

## 2014-02-03 MED ORDER — DOXYCYCLINE HYCLATE 100 MG PO TABS
ORAL_TABLET | ORAL | Status: AC
  Filled 2014-02-03: qty 1

## 2014-02-03 MED ORDER — DOXYCYCLINE HYCLATE 100 MG PO TABS
100.0000 mg | ORAL_TABLET | Freq: Once | ORAL | Status: AC
  Administered 2014-02-03: 100 mg via ORAL

## 2014-02-03 MED ORDER — PROMETHAZINE HCL 25 MG PO TABS
ORAL_TABLET | ORAL | Status: AC
  Filled 2014-02-03: qty 1

## 2014-02-03 NOTE — ED Provider Notes (Signed)
CSN: [id number]     Arrival date & time 02/03/14  0008 History  This chart was scribed for Alim Cattell Smitty Cords, MD by Danella Maiers, ED Scribe. This patient was seen in room MH09/MH09 and the patient's care was started at 12:25 AM.    Chief Complaint  Patient presents with  . Nail Problem   Patient is a 63 y.o. female presenting with extremity pain. The history is provided by the patient. No language interpreter was used.  Extremity Pain This is a new problem. The current episode started yesterday. The problem occurs constantly. The problem has not changed since onset.Pertinent negatives include no abdominal pain. Nothing aggravates the symptoms. Nothing relieves the symptoms. Treatments tried: dilaudid. The treatment provided no relief.  Patient denies trauma thought she has a scab on the thumb.   HPI Comments: Shawna Mitchell is a 63 y.o. female who presents to the Emergency Department complaining of gradually-worsening, constant pain, swelling, and discoloration to the left thumb nail onset when she woke up yesterday morning. Pt denies injury or trauma.   Past Medical History  Diagnosis Date  . Endometriosis   . Rectocele   . Urinary incontinence     USI  . Thyroid disease     Hypothyroid   Past Surgical History  Procedure Laterality Date  . Pelvic laparoscopy      Tubal lavage-laser vap of endometriosis  . Tubal ligation    . Vaginal hysterectomy      BSO-sling-Posterior repair  . Oophorectomy      BSO  . Shoulder surgery      Right   Family History  Problem Relation Age of Onset  . Hypertension Mother   . Hypertension Father   . Cancer Maternal Uncle     Colon cancer  . Breast cancer Paternal Grandmother    History  Substance Use Topics  . Smoking status: Never Smoker   . Smokeless tobacco: Never Used  . Alcohol Use: 0.5 oz/week    1 drink(s) per week   OB History   Grav Para Term Preterm Abortions TAB SAB Ect Mult Living   2 2 2      1 3      Review  of Systems  Gastrointestinal: Negative for abdominal pain.  All other systems reviewed and are negative.      Allergies  Codeine  Home Medications   Current Outpatient Rx  Name  Route  Sig  Dispense  Refill  . butalbital-acetaminophen-caffeine (FIORICET) 50-325-40 MG per tablet   Oral   Take 1-2 tablets by mouth every 6 (six) hours as needed for headache.   10 tablet   0   . Calcium Carbonate-Vitamin D (CALCIUM + D PO)   Oral   Take by mouth.           . escitalopram (LEXAPRO) 10 MG tablet   Oral   Take 10 mg by mouth daily.           Marland Kitchen escitalopram (LEXAPRO) 10 MG tablet      Take one every other day   30 tablet   6   . estradiol (CLIMARA - DOSED IN MG/24 HR) 0.075 mg/24hr   Transdermal   Place 1 patch (0.075 mg total) onto the skin once a week.   4 patch   11   . levothyroxine (SYNTHROID, LEVOTHROID) 75 MCG tablet      TAKE 1 TABLET BY MOUTH EVERY DAY   30 tablet   12   .  Ospemifene (OSPHENA) 60 MG TABS   Oral   Take 60 mg by mouth daily.   30 tablet   11    BP 152/74  Pulse 94  Temp(Src) 97.6 F (36.4 C) (Oral)  Resp 22  Ht 5\' 8"  (1.727 m)  Wt 140 lb (63.504 kg)  BMI 21.29 kg/m2  SpO2 96% Physical Exam  Nursing note and vitals reviewed. Constitutional: She is oriented to person, place, and time. She appears well-developed and well-nourished. No distress.  HENT:  Head: Normocephalic and atraumatic.  Eyes: EOM are normal. Pupils are equal, round, and reactive to light.  Neck: Normal range of motion. Neck supple. No tracheal deviation present.  Cardiovascular: Normal rate.   Pulmonary/Chest: Effort normal. No respiratory distress.  Abdominal: Soft. Bowel sounds are normal.  Musculoskeletal: Normal range of motion.  Hematoma of the left thumb tip lateral to the nail and with ~20% nail involvement. No paronychia.  Scab  Neurological: She is alert and oriented to person, place, and time.  Skin: Skin is warm and dry.  Psychiatric: She has a  normal mood and affect. Her behavior is normal.    ED Course  Procedures (including critical care time) Medications - No data to display  DIAGNOSTIC STUDIES: Oxygen Saturation is 96% on RA, normal by my interpretation.    COORDINATION OF CARE: 12:33 AM- Discussed treatment plan with pt. Pt agrees to plan.    Labs Review Labs Reviewed - No data to display Imaging Review No results found.  EKG Interpretation   None       MDM   Final diagnoses:  None    20% subungual hematoma, finger tip hematoma.  Will not ID tissue hematoma.  No paronychia.  Scab on finger. Patient on oxycodone, dilaudid and celebrex.  Ice Elevation and follow up with hand for recheck  I personally performed the services described in this documentation, which was scribed in my presence. The recorded information has been reviewed and is accurate.    Jasmine AweApril K Cristle Jared-Rasch, MD 02/03/14 [phone]

## 2014-02-03 NOTE — ED Notes (Signed)
Swelling, discoloration to left thumb nail.  Onset yesterday, no known injury or causative factor.

## 2014-03-18 ENCOUNTER — Other Ambulatory Visit: Payer: Self-pay | Admitting: Women's Health

## 2014-03-20 ENCOUNTER — Other Ambulatory Visit: Payer: Self-pay

## 2014-03-20 MED ORDER — LEVOTHYROXINE SODIUM 75 MCG PO TABS: ORAL_TABLET | ORAL | Status: DC

## 2014-03-20 MED ORDER — ESTRADIOL 0.075 MG/24HR TD PTWK: 0.0750 mg | MEDICATED_PATCH | TRANSDERMAL | Status: DC

## 2014-03-20 NOTE — Telephone Encounter (Signed)
Patient called to explain that she was behind getting her annual exam as she has had some major surgery.  She has appt scheduled for 04/10/14 for her annual. Meantime, she is requesting refills on her Synthroid and generic Climara patch.  I refilled these each one time and pt informed.

## 2014-04-10 ENCOUNTER — Encounter: Payer: Self-pay | Admitting: Gynecology

## 2014-04-10 ENCOUNTER — Ambulatory Visit (INDEPENDENT_AMBULATORY_CARE_PROVIDER_SITE_OTHER): Payer: PRIVATE HEALTH INSURANCE | Admitting: Gynecology

## 2014-04-10 VITALS — BP 122/80 | Ht 66.0 in | Wt 149.0 lb

## 2014-04-10 DIAGNOSIS — Z1159 Encounter for screening for other viral diseases: Secondary | ICD-10-CM

## 2014-04-10 DIAGNOSIS — N952 Postmenopausal atrophic vaginitis: Secondary | ICD-10-CM

## 2014-04-10 DIAGNOSIS — M79609 Pain in unspecified limb: Secondary | ICD-10-CM

## 2014-04-10 DIAGNOSIS — E039 Hypothyroidism, unspecified: Secondary | ICD-10-CM

## 2014-04-10 DIAGNOSIS — Z01419 Encounter for gynecological examination (general) (routine) without abnormal findings: Secondary | ICD-10-CM

## 2014-04-10 DIAGNOSIS — R252 Cramp and spasm: Secondary | ICD-10-CM

## 2014-04-10 MED ORDER — LEVOTHYROXINE SODIUM 75 MCG PO TABS: ORAL_TABLET | ORAL | Status: DC

## 2014-04-10 MED ORDER — ESTRADIOL 0.05 MG/24HR TD PTWK: 0.0500 mg | MEDICATED_PATCH | TRANSDERMAL | Status: AC

## 2014-04-10 NOTE — Patient Instructions (Signed)
Shingles Vaccine  What You Need to Know  WHAT IS SHINGLES?  · Shingles is a painful skin rash, often with blisters. It is also called Herpes Zoster or just Zoster.  · A shingles rash usually appears on one side of the face or body and lasts from 2 to 4 weeks. Its main symptom is pain, which can be quite severe. Other symptoms of shingles can include fever, headache, chills, and upset stomach. Very rarely, a shingles infection can lead to pneumonia, hearing problems, blindness, brain inflammation (encephalitis), or death.  · For about 1 person in 5, severe pain can continue even after the rash clears up. This is called post-herpetic neuralgia.  · Shingles is caused by the Varicella Zoster virus. This is the same virus that causes chickenpox. Only someone who has had a case of chickenpox or rarely, has gotten chickenpox vaccine, can get shingles. The virus stays in your body. It can reappear many years later to cause a case of shingles.  · You cannot catch shingles from another person with shingles. However, a person who has never had chickenpox (or chickenpox vaccine) could get chickenpox from someone with shingles. This is not very common.  · Shingles is far more common in people 50 and older than in younger people. It is also more common in people whose immune systems are weakened because of a disease such as cancer or drugs such as steroids or chemotherapy.  · At least 1 million people get shingles per year in the [place].  SHINGLES VACCINE  · A vaccine for shingles was licensed in 2006. In clinical trials, the vaccine reduced the risk of shingles by 50%. It can also reduce the pain in people who still get shingles after being vaccinated.  · A single dose of shingles vaccine is recommended for adults 60 years of age and older.  SOME PEOPLE SHOULD NOT GET SHINGLES VACCINE OR SHOULD WAIT  A person should not get shingles vaccine if he or she:  · Has ever had a life-threatening allergic reaction to gelatin, the  antibiotic neomycin, or any other component of shingles vaccine. Tell your caregiver if you have any severe allergies.  · Has a weakened immune system because of current:  · AIDS or another disease that affects the immune system.  · Treatment with drugs that affect the immune system, such as prolonged use of high-dose steroids.  · Cancer treatment, such as radiation or chemotherapy.  · Cancer affecting the bone marrow or lymphatic system, such as leukemia or lymphoma.  · Is pregnant, or might be pregnant. Women should not become pregnant until at least 4 weeks after getting shingles vaccine.  Someone with a minor illness, such as a cold, may be vaccinated. Anyone with a moderate or severe acute illness should usually wait until he or she recovers before getting the vaccine. This includes anyone with a temperature of 101.3° F (38° C) or higher.  WHAT ARE THE RISKS FROM SHINGLES VACCINE?  · A vaccine, like any medicine, could possibly cause serious problems, such as severe allergic reactions. However, the risk of a vaccine causing serious harm, or death, is extremely small.  · No serious problems have been identified with shingles vaccine.  Mild Problems  · Redness, soreness, swelling, or itching at the site of the injection (about 1 person in 3).  · Headache (about 1 person in 70).  Like all vaccines, shingles vaccine is being closely monitored for unusual or severe problems.  WHAT IF   THERE IS A MODERATE OR SEVERE REACTION?  What should I look for?  Any unusual condition, such as a severe allergic reaction or a high fever. If a severe allergic reaction occurred, it would be within a few minutes to an hour after the shot. Signs of a serious allergic reaction can include difficulty breathing, weakness, hoarseness or wheezing, a fast heartbeat, hives, dizziness, paleness, or swelling of the throat.  What should I do?  · Call your caregiver, or get the person to a caregiver right away.  · Tell the caregiver what  happened, the date and time it happened, and when the vaccination was given.  · Ask the caregiver to report the reaction by filing a Vaccine Adverse Event Reporting System (VAERS) form. Or, you can file this report through the VAERS web site at www.vaers.hhs.gov or by calling 1-800-822-7967.  VAERS does not provide medical advice.  HOW CAN I LEARN MORE?  · Ask your caregiver. He or she can give you the vaccine package insert or suggest other sources of information.  · Contact the Centers for Disease Control and Prevention (CDC):  · Call 1-800-232-4636 (1-800-CDC-INFO).  · Visit the CDC website at www.cdc.gov/vaccines  CDC Shingles Vaccine VIS (09/24/08)  Document Released: 10/03/2006 Document Revised: 02/28/2012 Document Reviewed: 03/28/2013  [employer]® Patient Information ©2014 [employer].

## 2014-04-10 NOTE — Progress Notes (Signed)
Nolon LennertLinda Diane Hipolito 04-14-1951 [id number]   History:    63 y.o.  for annual gyn exam who last year was placed on osphena for vaginal atrophy but she took for 6 weeks and discontinued because of leg cramps. She is on Climara 0.075 weekly which she had been started on a 2002 after [facility] BSO and sling procedure. Patient's last bone density study was in 2010 at another facility report indicated that she had normal bone density. Her last colonoscopy was in 2012 which is reported to be normal. Patient had an uncle with colon cancer. She is currently taking her calcium and vitamin D for osteoporosis prevention. She was seeing a primary care physician at [facility] but has not seen them and ovary year. Patient with history of hypothyroidism doing well on Synthroid 75 mcg daily.  Past medical history,surgical history, family history and social history were all reviewed and documented in the EPIC chart.  Gynecologic History No LMP recorded. Patient has had a hysterectomy. Contraception: status post hysterectomy Last Pap: 2013. Results were: normal Last mammogram: 2014. Results were: Normal, dense breast and saline implants had 3D  Obstetric History OB History  Gravida Para Term Preterm AB SAB TAB Ectopic Multiple Living  2 2 2      1 3     # Outcome Date GA Lbr Len/2nd Weight Sex Delivery Anes PTL Lv  2 TRM           1 TRM                ROS: A ROS was performed and pertinent positives and negatives are included in the history.  GENERAL: No fevers or chills. HEENT: No change in vision, no earache, sore throat or sinus congestion. NECK: No pain or stiffness. CARDIOVASCULAR: No chest pain or pressure. No palpitations. PULMONARY: No shortness of breath, cough or wheeze. GASTROINTESTINAL: No abdominal pain, nausea, vomiting or diarrhea, melena or bright red blood per rectum. GENITOURINARY: No urinary frequency, urgency, hesitancy or dysuria. MUSCULOSKELETAL: No joint or muscle pain, no  back pain, no recent trauma. DERMATOLOGIC: No rash, no itching, no lesions. ENDOCRINE: No polyuria, polydipsia, no heat or cold intolerance. No recent change in weight. HEMATOLOGICAL: No anemia or easy bruising or bleeding. NEUROLOGIC: No headache, seizures, numbness, tingling or weakness. PSYCHIATRIC: No depression, no loss of interest in normal activity or change in sleep pattern.     Exam: chaperone present  BP 122/80  Ht 5\' 6"  (1.676 m)  Wt 149 lb (67.586 kg)  BMI 24.06 kg/m2  Body mass index is 24.06 kg/(m^2).  General appearance : Well developed well nourished female. No acute distress HEENT: Neck supple, trachea midline, no carotid bruits, no thyroidmegaly Lungs: Clear to auscultation, no rhonchi or wheezes, or rib retractions  Heart: Regular rate and rhythm, no murmurs or gallops Breast:Examined in sitting and supine position were symmetrical in appearance, no palpable masses or tenderness,  no skin retraction, no nipple inversion, no nipple discharge, no skin discoloration, no axillary or supraclavicular lymphadenopathy Abdomen: no palpable masses or tenderness, no rebound or guarding Extremities: no edema or skin discoloration or tenderness  Pelvic:  Bartholin, Urethra, Skene Glands: Within normal limits             Vagina: No gross lesions or discharge, vaginal atrophy  Cervix: Absent  Uterus  Absent  Adnexa  Without masses or tenderness  Anus and perineum  normal   Rectovaginal  normal sphincter tone without palpated masses or tenderness  Hemoccult cards provided     Assessment/Plan:  63 y.o. female for annual exam who complains of vaginal dryness despite being on transdermal estrogen. We are going to decrease her transdermal estrogen Vivelle-Dot to 0.05 twice a week and she will use vaginal estradiol twice a week to help with her vaginal atrophy. The risks benefits and pros and cons were discussed. Patient will return back later this week for her fasting blood  work which would include the following: CBC, fasting lipid profile, comprehensive metabolic panel, TSH and urinalysis. She was provided with fecal Hemoccult cards to submit to the office for testing. The new Pap smear screening guidelines were discussed. She was not on the course of calcium vitamin D and regular exercise for osteoporosis prevention. We discussed importance of monthly breast exams.  New CDC guidelines is recommending patients be tested once in her lifetime for hepatitis C antibody who were born between [birth year] through 1964. This was discussed with the patient today and has agreed to be tested today.  Note: This dictation was prepared with  Dragon/digital dictation along withSmart phrase technology. Any transcriptional errors that result from this process are unintentional.   Ok EdwardsJuan H Fernandez MD, 10:48 AM 04/10/2014   [facility]

## 2014-05-28 ENCOUNTER — Other Ambulatory Visit: Payer: PRIVATE HEALTH INSURANCE

## 2014-05-28 DIAGNOSIS — Z01419 Encounter for gynecological examination (general) (routine) without abnormal findings: Secondary | ICD-10-CM

## 2014-05-28 DIAGNOSIS — Z1159 Encounter for screening for other viral diseases: Secondary | ICD-10-CM

## 2014-05-28 LAB — LIPID PANEL
CHOL/HDL RATIO: 2.3 ratio
CHOLESTEROL: 175 mg/dL (ref 0–200)
HDL: 75 mg/dL (ref >39)
LDL Cholesterol: 88 mg/dL (ref 0–99)
Triglycerides: 59 mg/dL (ref <150)
VLDL: 12 mg/dL (ref 0–40)

## 2014-05-28 LAB — COMPREHENSIVE METABOLIC PANEL
ALT: 10 U/L (ref 0–35)
AST: 16 U/L (ref 0–37)
Albumin: 4.4 g/dL (ref 3.5–5.2)
Alkaline Phosphatase: 41 U/L (ref 39–117)
BUN: 22 mg/dL (ref 6–23)
CALCIUM: 9.3 mg/dL (ref 8.4–10.5)
CHLORIDE: 104 meq/L (ref 96–112)
CO2: 25 meq/L (ref 19–32)
CREATININE: 0.97 mg/dL (ref 0.50–1.10)
Glucose, Bld: 82 mg/dL (ref 70–99)
POTASSIUM: 4.6 meq/L (ref 3.5–5.3)
Sodium: 139 mEq/L (ref 135–145)
Total Bilirubin: 0.7 mg/dL (ref 0.2–1.2)
Total Protein: 7.1 g/dL (ref 6.0–8.3)

## 2014-05-28 LAB — CBC WITH DIFFERENTIAL/PLATELET
Basophils Absolute: 0.1 10*3/uL (ref 0.0–0.1)
Basophils Relative: 2 % — ABNORMAL HIGH (ref 0–1)
EOS PCT: 4 % (ref 0–5)
Eosinophils Absolute: 0.1 10*3/uL (ref 0.0–0.7)
HEMATOCRIT: 39.8 % (ref 36.0–46.0)
Hemoglobin: 13.7 g/dL (ref 12.0–15.0)
LYMPHS ABS: 1.3 10*3/uL (ref 0.7–4.0)
LYMPHS PCT: 36 % (ref 12–46)
MCH: 30.7 pg (ref 26.0–34.0)
MCHC: 34.4 g/dL (ref 30.0–36.0)
MCV: 89.2 fL (ref 78.0–100.0)
MONO ABS: 0.3 10*3/uL (ref 0.1–1.0)
Monocytes Relative: 9 % (ref 3–12)
NEUTROS ABS: 1.8 10*3/uL (ref 1.7–7.7)
Neutrophils Relative %: 49 % (ref 43–77)
PLATELETS: 236 10*3/uL (ref 150–400)
RBC: 4.46 MIL/uL (ref 3.87–5.11)
RDW: 12.9 % (ref 11.5–15.5)
WBC: 3.7 10*3/uL — AB (ref 4.0–10.5)

## 2014-05-28 LAB — TSH: TSH: 3.162 u[IU]/mL (ref 0.350–4.500)

## 2014-05-28 LAB — HEPATITIS C ANTIBODY: HCV AB: NEGATIVE

## 2014-10-21 ENCOUNTER — Encounter: Payer: Self-pay | Admitting: Gynecology

## 2015-03-10 ENCOUNTER — Other Ambulatory Visit: Payer: Self-pay | Admitting: Physician Assistant

## 2015-04-16 ENCOUNTER — Other Ambulatory Visit: Payer: Self-pay | Admitting: Gynecology

## 2016-07-01 ENCOUNTER — Other Ambulatory Visit: Payer: Self-pay | Admitting: Obstetrics and Gynecology

## 2016-07-01 DIAGNOSIS — N6310 Unspecified lump in the right breast, unspecified quadrant: Secondary | ICD-10-CM

## 2016-07-06 ENCOUNTER — Ambulatory Visit
Admission: RE | Admit: 2016-07-06 | Discharge: 2016-07-06 | Disposition: A | Discharge status: Home / Self Care | Payer: PRIVATE HEALTH INSURANCE | Source: Ambulatory Visit | Attending: Obstetrics and Gynecology | Admitting: Obstetrics and Gynecology

## 2016-07-06 DIAGNOSIS — N6310 Unspecified lump in the right breast, unspecified quadrant: Secondary | ICD-10-CM

## 2016-07-13 ENCOUNTER — Other Ambulatory Visit: Payer: Self-pay | Admitting: Obstetrics and Gynecology

## 2016-07-13 DIAGNOSIS — N6323 Unspecified lump in the left breast, lower outer quadrant: Secondary | ICD-10-CM

## 2016-07-21 ENCOUNTER — Ambulatory Visit
Admission: RE | Admit: 2016-07-21 | Discharge: 2016-07-21 | Disposition: A | Discharge status: Home / Self Care | Payer: PRIVATE HEALTH INSURANCE | Source: Ambulatory Visit | Attending: Obstetrics and Gynecology | Admitting: Obstetrics and Gynecology

## 2016-07-21 DIAGNOSIS — N6323 Unspecified lump in the left breast, lower outer quadrant: Secondary | ICD-10-CM

## 2016-07-21 MED ORDER — GADOBENATE DIMEGLUMINE 529 MG/ML IV SOLN
13.0000 mL | Freq: Once | INTRAVENOUS | Status: AC | PRN
  Administered 2016-07-21: 13 mL via INTRAVENOUS

## 2017-05-04 ENCOUNTER — Encounter: Payer: Self-pay | Admitting: Gynecology

## 2017-07-07 ENCOUNTER — Other Ambulatory Visit: Payer: Self-pay | Admitting: Obstetrics and Gynecology

## 2017-07-07 DIAGNOSIS — Z9882 Breast implant status: Secondary | ICD-10-CM

## 2017-07-07 DIAGNOSIS — N63 Unspecified lump in unspecified breast: Secondary | ICD-10-CM

## 2017-07-08 ENCOUNTER — Ambulatory Visit
Admission: RE | Admit: 2017-07-08 | Discharge: 2017-07-08 | Disposition: A | Discharge status: Home / Self Care | Payer: Medicare Other | Source: Ambulatory Visit | Attending: Obstetrics and Gynecology | Admitting: Obstetrics and Gynecology

## 2017-07-08 DIAGNOSIS — Z9882 Breast implant status: Secondary | ICD-10-CM

## 2017-07-08 DIAGNOSIS — N63 Unspecified lump in unspecified breast: Secondary | ICD-10-CM

## 2017-07-08 MED ORDER — GADOBENATE DIMEGLUMINE 529 MG/ML IV SOLN
15.0000 mL | Freq: Once | INTRAVENOUS | Status: AC | PRN
  Administered 2017-07-08: 15 mL via INTRAVENOUS

## 2018-07-21 ENCOUNTER — Other Ambulatory Visit: Payer: Self-pay | Admitting: Obstetrics and Gynecology

## 2018-07-24 ENCOUNTER — Other Ambulatory Visit: Payer: Self-pay | Admitting: Obstetrics and Gynecology

## 2018-07-24 DIAGNOSIS — R921 Mammographic calcification found on diagnostic imaging of breast: Secondary | ICD-10-CM

## 2018-07-27 ENCOUNTER — Ambulatory Visit
Admission: RE | Admit: 2018-07-27 | Discharge: 2018-07-27 | Disposition: A | Discharge status: Home / Self Care | Payer: Medicare Other | Source: Ambulatory Visit | Attending: Obstetrics and Gynecology | Admitting: Obstetrics and Gynecology

## 2018-07-27 ENCOUNTER — Ambulatory Visit: Payer: Self-pay

## 2018-07-27 ENCOUNTER — Other Ambulatory Visit: Payer: Self-pay | Admitting: Obstetrics and Gynecology

## 2018-07-27 DIAGNOSIS — R921 Mammographic calcification found on diagnostic imaging of breast: Secondary | ICD-10-CM

## 2018-07-27 DIAGNOSIS — Z1231 Encounter for screening mammogram for malignant neoplasm of breast: Secondary | ICD-10-CM

## 2018-12-27 ENCOUNTER — Other Ambulatory Visit: Payer: Self-pay | Admitting: Plastic Surgery

## 2020-01-08 ENCOUNTER — Other Ambulatory Visit: Payer: Self-pay | Admitting: Obstetrics and Gynecology

## 2020-01-08 DIAGNOSIS — R928 Other abnormal and inconclusive findings on diagnostic imaging of breast: Secondary | ICD-10-CM

## 2020-01-16 ENCOUNTER — Ambulatory Visit
Admission: RE | Admit: 2020-01-16 | Discharge: 2020-01-16 | Disposition: A | Discharge status: Home / Self Care | Payer: Medicare Other | Source: Ambulatory Visit | Attending: Obstetrics and Gynecology | Admitting: Obstetrics and Gynecology

## 2020-01-16 ENCOUNTER — Other Ambulatory Visit: Payer: Self-pay

## 2020-01-16 ENCOUNTER — Other Ambulatory Visit: Payer: Self-pay | Admitting: Obstetrics and Gynecology

## 2020-01-16 DIAGNOSIS — R928 Other abnormal and inconclusive findings on diagnostic imaging of breast: Secondary | ICD-10-CM

## 2020-01-16 DIAGNOSIS — N6310 Unspecified lump in the right breast, unspecified quadrant: Secondary | ICD-10-CM

## 2020-07-16 ENCOUNTER — Other Ambulatory Visit: Payer: Self-pay | Admitting: Obstetrics and Gynecology

## 2020-07-16 ENCOUNTER — Ambulatory Visit
Admission: RE | Admit: 2020-07-16 | Discharge: 2020-07-16 | Disposition: A | Discharge status: Home / Self Care | Payer: Medicare Other | Source: Ambulatory Visit | Attending: Obstetrics and Gynecology | Admitting: Obstetrics and Gynecology

## 2020-07-16 ENCOUNTER — Other Ambulatory Visit: Payer: Self-pay

## 2020-07-16 DIAGNOSIS — N6310 Unspecified lump in the right breast, unspecified quadrant: Secondary | ICD-10-CM

## 2020-10-01 LAB — COLOGUARD: COLOGUARD: NEGATIVE

## 2021-01-19 ENCOUNTER — Ambulatory Visit
Admission: RE | Admit: 2021-01-19 | Discharge: 2021-01-19 | Disposition: A | Discharge status: Home / Self Care | Payer: Medicare Other | Source: Ambulatory Visit | Attending: Obstetrics and Gynecology | Admitting: Obstetrics and Gynecology

## 2021-01-19 ENCOUNTER — Other Ambulatory Visit: Payer: Self-pay

## 2021-01-19 ENCOUNTER — Other Ambulatory Visit: Payer: Self-pay | Admitting: Obstetrics and Gynecology

## 2021-01-19 DIAGNOSIS — N6310 Unspecified lump in the right breast, unspecified quadrant: Secondary | ICD-10-CM

## 2021-12-15 ENCOUNTER — Other Ambulatory Visit: Payer: Self-pay | Admitting: Obstetrics and Gynecology

## 2021-12-15 DIAGNOSIS — N6310 Unspecified lump in the right breast, unspecified quadrant: Secondary | ICD-10-CM

## 2022-01-20 ENCOUNTER — Other Ambulatory Visit: Payer: Self-pay | Admitting: Obstetrics and Gynecology

## 2022-01-20 ENCOUNTER — Ambulatory Visit
Admission: RE | Admit: 2022-01-20 | Discharge: 2022-01-20 | Disposition: A | Discharge status: Home / Self Care | Payer: Medicare Other | Source: Ambulatory Visit | Attending: Obstetrics and Gynecology | Admitting: Obstetrics and Gynecology

## 2022-01-20 ENCOUNTER — Other Ambulatory Visit: Payer: Self-pay

## 2022-01-20 DIAGNOSIS — N6310 Unspecified lump in the right breast, unspecified quadrant: Secondary | ICD-10-CM

## 2022-01-20 DIAGNOSIS — N6001 Solitary cyst of right breast: Secondary | ICD-10-CM

## 2022-01-29 ENCOUNTER — Other Ambulatory Visit: Payer: Self-pay

## 2022-01-29 ENCOUNTER — Ambulatory Visit
Admission: RE | Admit: 2022-01-29 | Discharge: 2022-01-29 | Disposition: A | Discharge status: Home / Self Care | Payer: Medicare Other | Source: Ambulatory Visit | Attending: Obstetrics and Gynecology | Admitting: Obstetrics and Gynecology

## 2022-01-29 DIAGNOSIS — N6001 Solitary cyst of right breast: Secondary | ICD-10-CM

## 2022-01-29 DIAGNOSIS — N6310 Unspecified lump in the right breast, unspecified quadrant: Secondary | ICD-10-CM

## 2022-01-29 HISTORY — PX: BREAST BIOPSY: SHX20

## 2023-01-13 ENCOUNTER — Other Ambulatory Visit: Payer: Self-pay | Admitting: Obstetrics and Gynecology

## 2023-01-13 DIAGNOSIS — N6310 Unspecified lump in the right breast, unspecified quadrant: Secondary | ICD-10-CM

## 2023-03-03 ENCOUNTER — Ambulatory Visit
Admission: RE | Admit: 2023-03-03 | Discharge: 2023-03-03 | Disposition: A | Discharge status: Home / Self Care | Payer: Medicare Other | Source: Ambulatory Visit | Attending: Obstetrics and Gynecology | Admitting: Obstetrics and Gynecology

## 2023-03-03 ENCOUNTER — Other Ambulatory Visit: Payer: Self-pay | Admitting: Obstetrics and Gynecology

## 2023-03-03 ENCOUNTER — Ambulatory Visit: Admission: RE | Admit: 2023-03-03 | Payer: Medicare Other | Source: Ambulatory Visit

## 2023-03-03 DIAGNOSIS — N6310 Unspecified lump in the right breast, unspecified quadrant: Secondary | ICD-10-CM

## 2023-08-18 IMAGING — MG MM BREAST LOCALIZATION CLIP
4 series · 4 of 12 positions shown · non-contrast
Comparison: Previous exam(s).

CLINICAL DATA: Evaluate biopsy marker

EXAM:
3D DIAGNOSTIC RIGHT MAMMOGRAM POST ULTRASOUND BIOPSY

[R CC synth-2D]
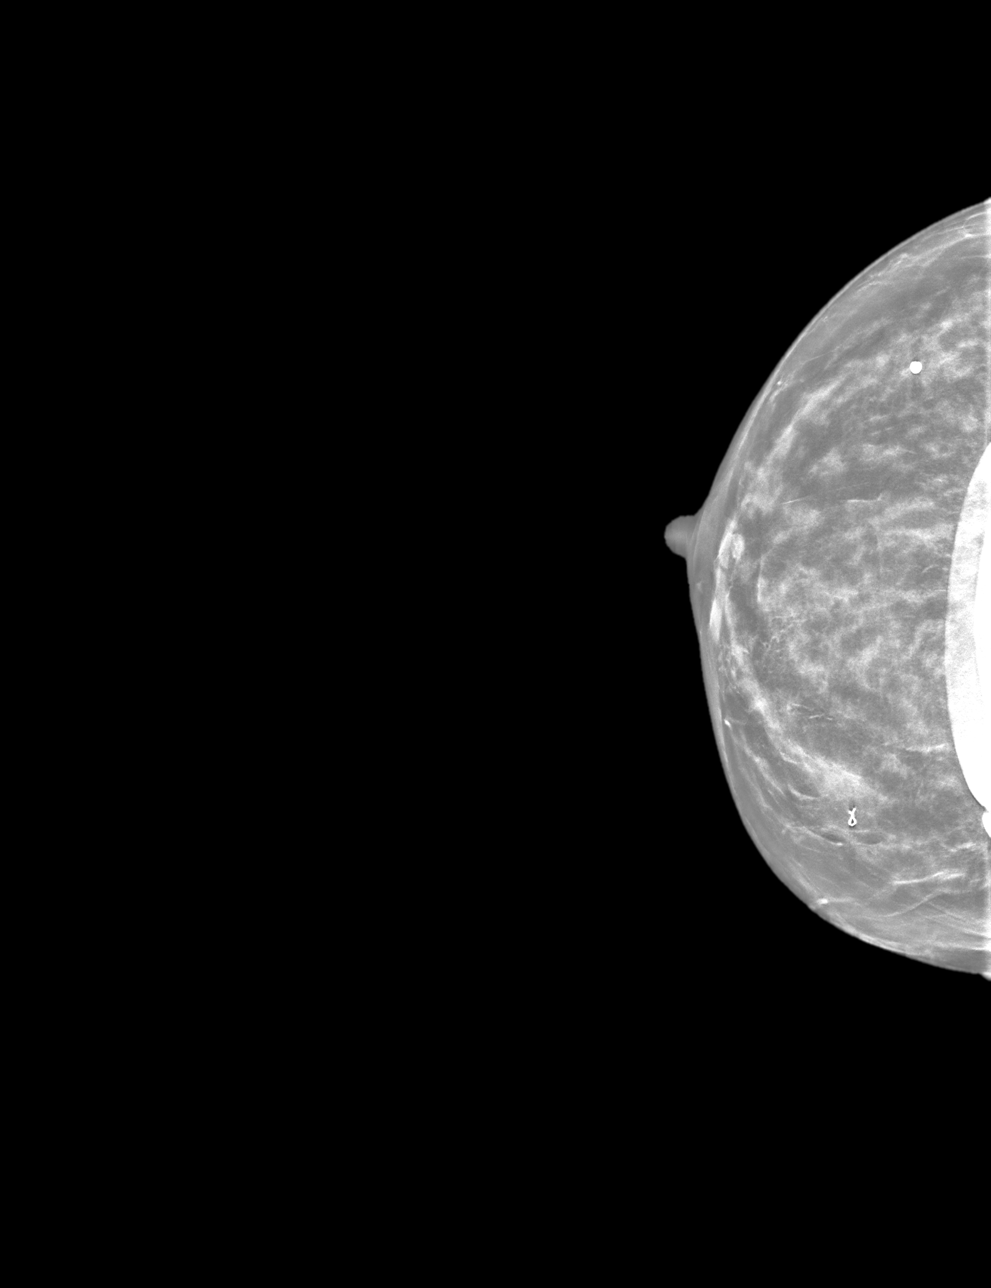

[R ML synth-2D]
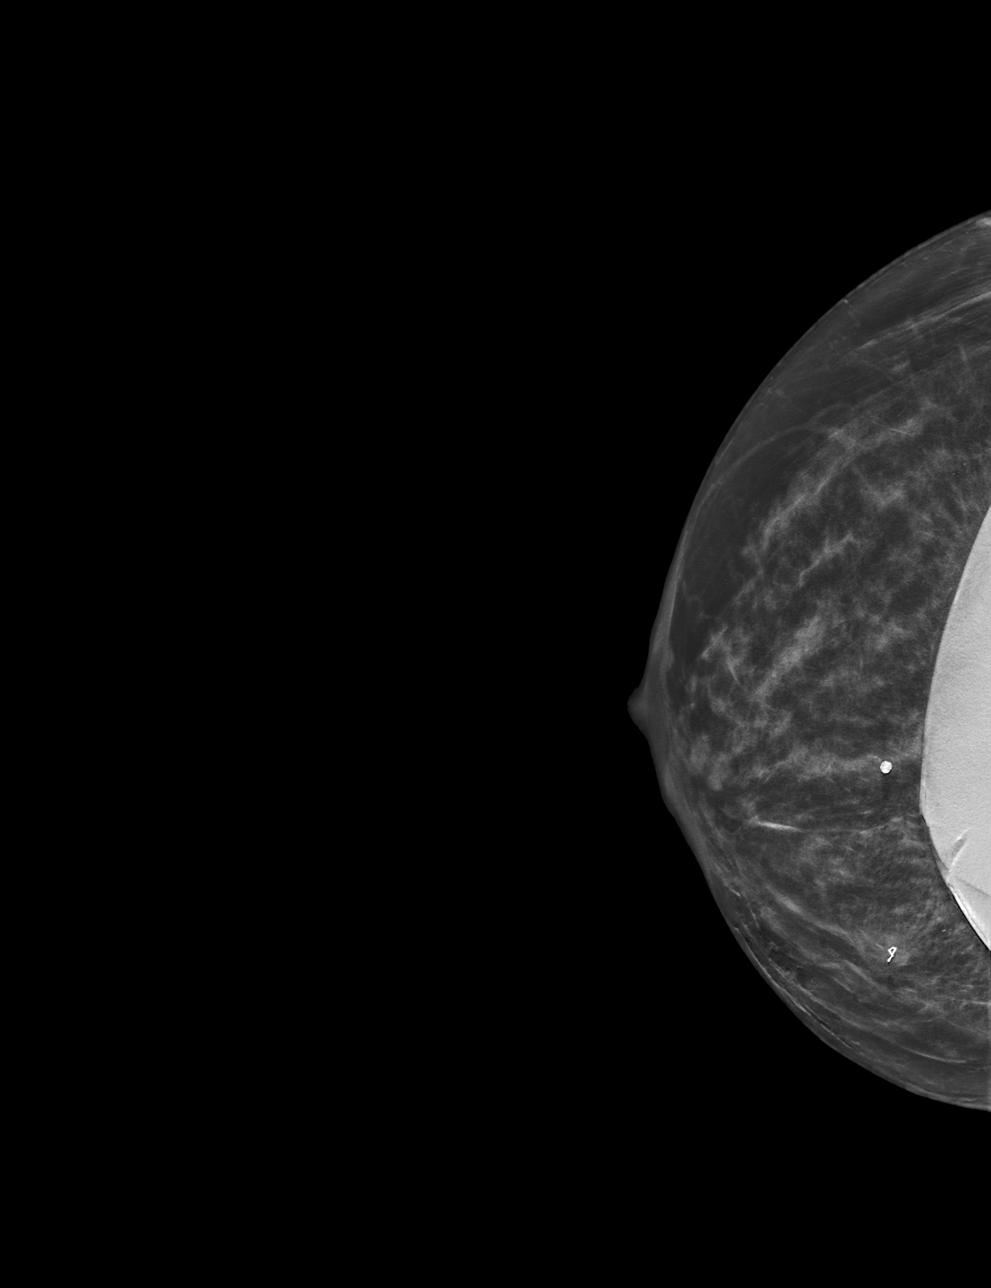

[R CC tomo · tomo slice 29/57.0]
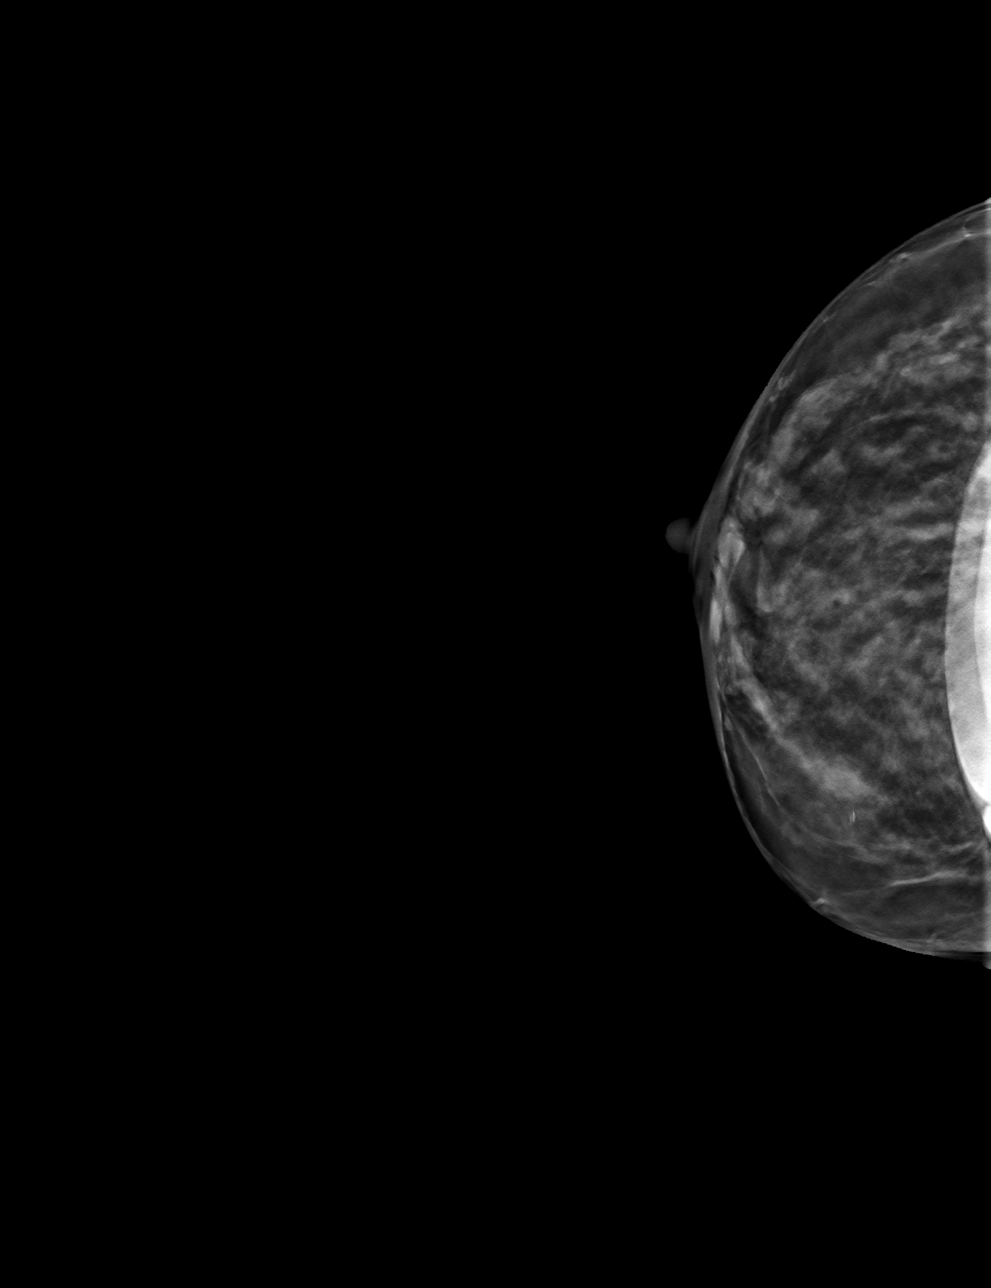

[R ML tomo · tomo slice 29/56.0]
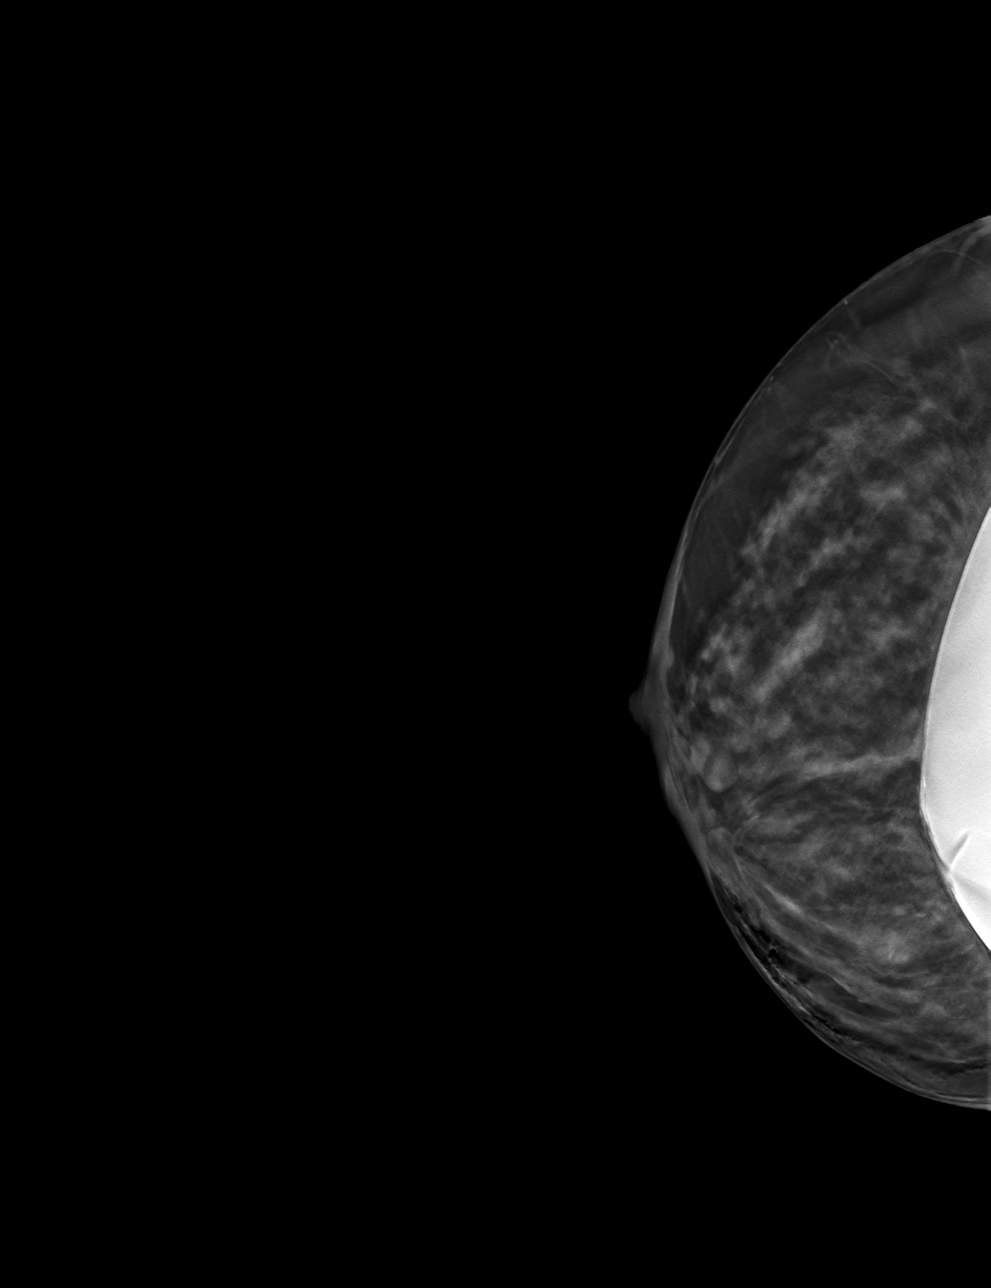

[4 of 12 positions shown; findings below may reference images not displayed]

FINDINGS: 3D Mammographic images were obtained following ultrasound guided
biopsy of a right breast mass. The biopsy marking clip is in
expected position at the site of biopsy.
IMPRESSION: Appropriate positioning of the ribbon shaped biopsy marking clip at
the site of biopsy in the biopsied right breast mass.

Final Assessment: Post Procedure Mammograms for Marker Placement

## 2023-08-18 IMAGING — US US  BREAST BX W/ LOC DEV 1ST LESION IMG BX SPEC US GUIDE*R*
1 series · 12 of 15 positions shown · non-contrast
Comparison: Previous exam(s).
COMPARISON: Previous exam(s).

Addendum:
CLINICAL DATA: Biopsy of a 4 o'clock right breast mass

EXAM:
ULTRASOUND GUIDED RIGHT BREAST CORE NEEDLE BIOPSY

[Series 1: us breast bx w/ loc dev 1st lesion img bx spec us  · 0.04mm/px · 12 of 15 slices shown]
[im 1/15]
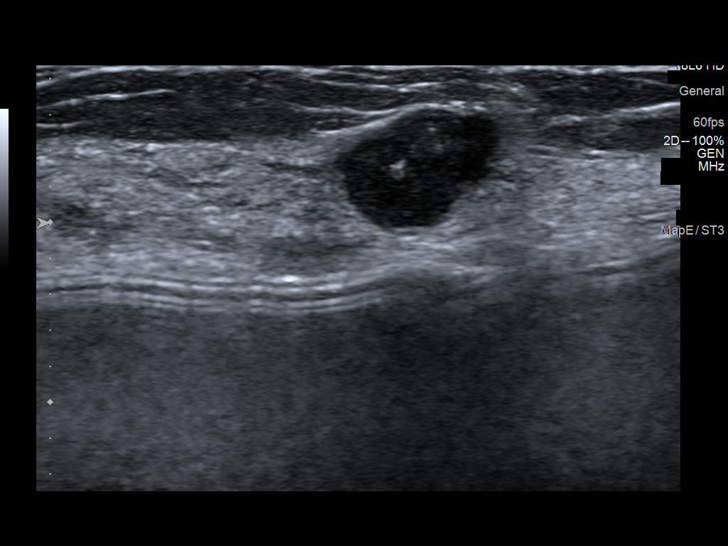
[im 2/15]
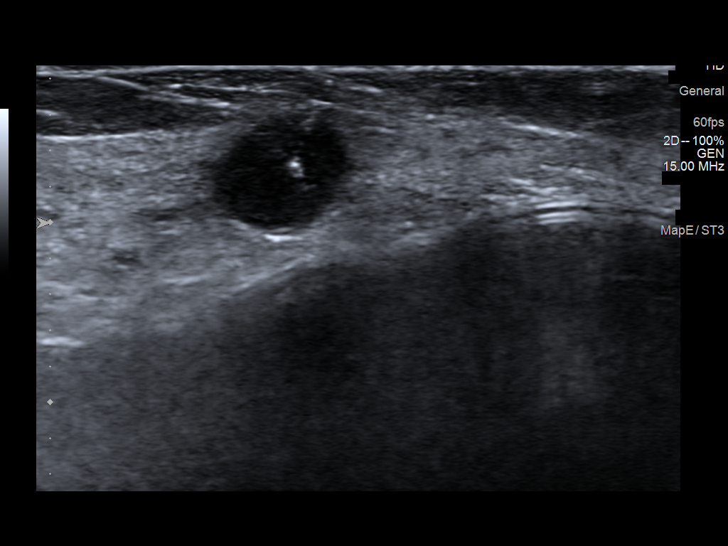
[im 4/15]
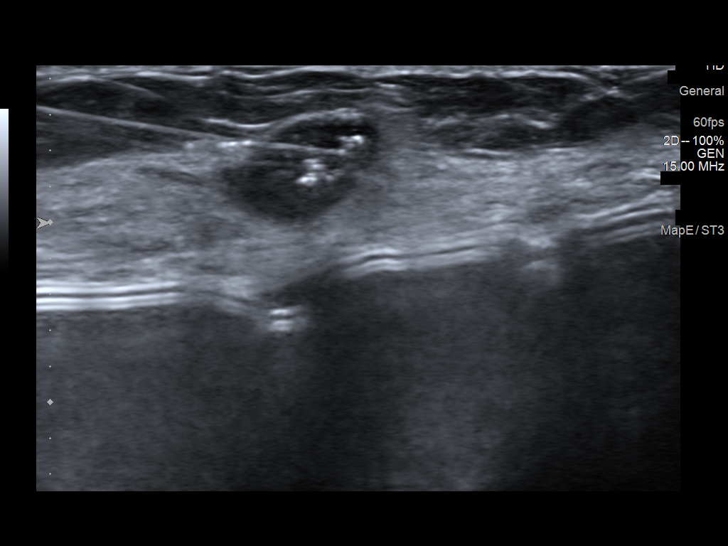
[im 5/15]
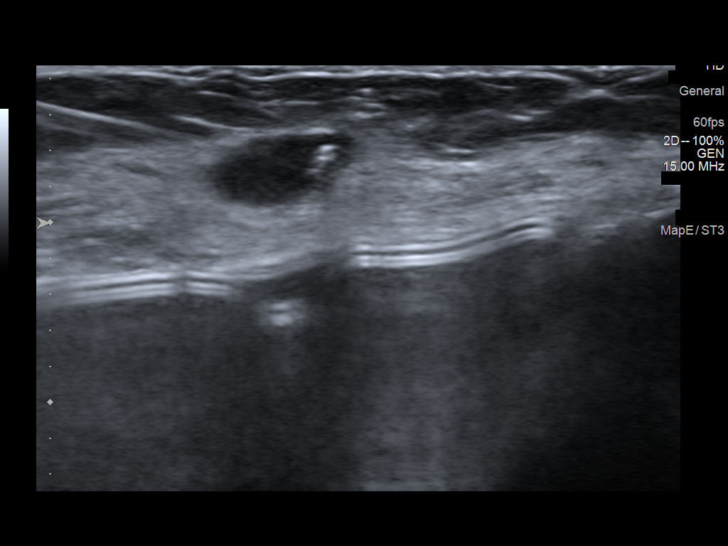
[im 6/15]
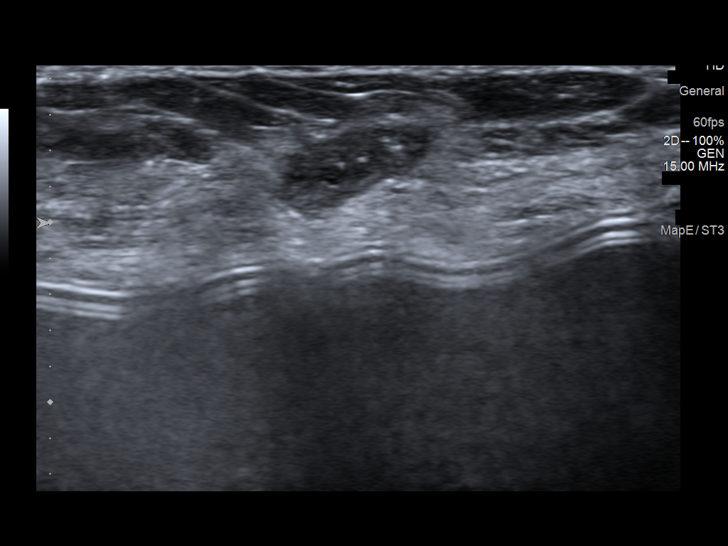
[im 7/15]
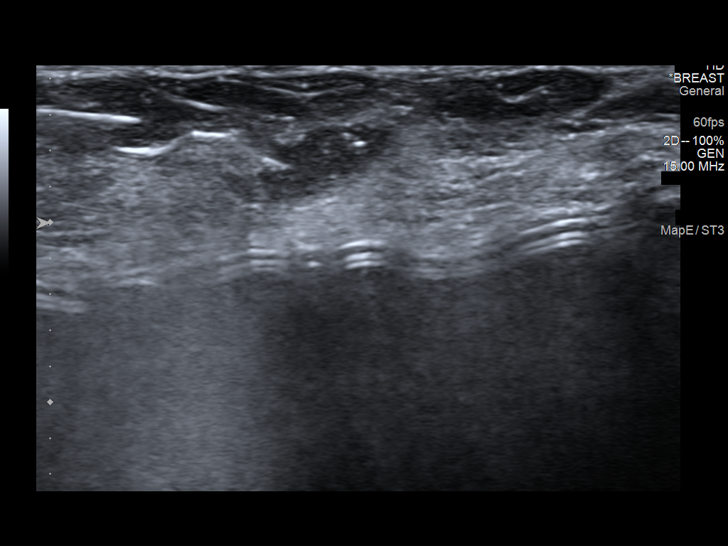
[im 9/15]
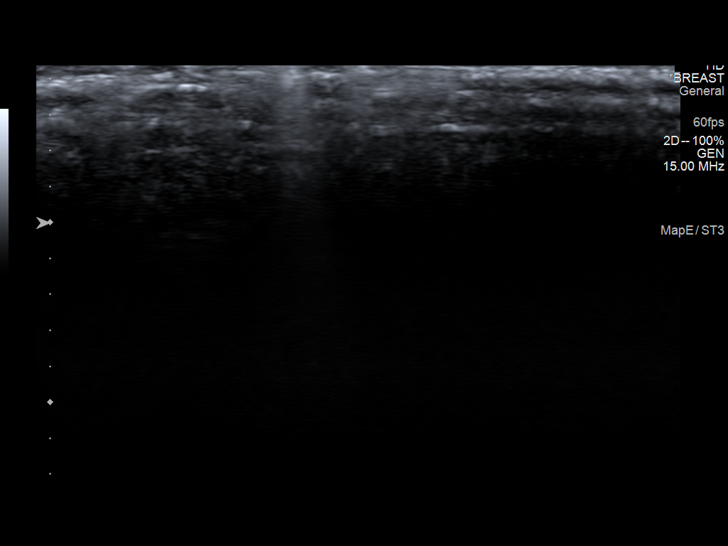
[im 10/15]
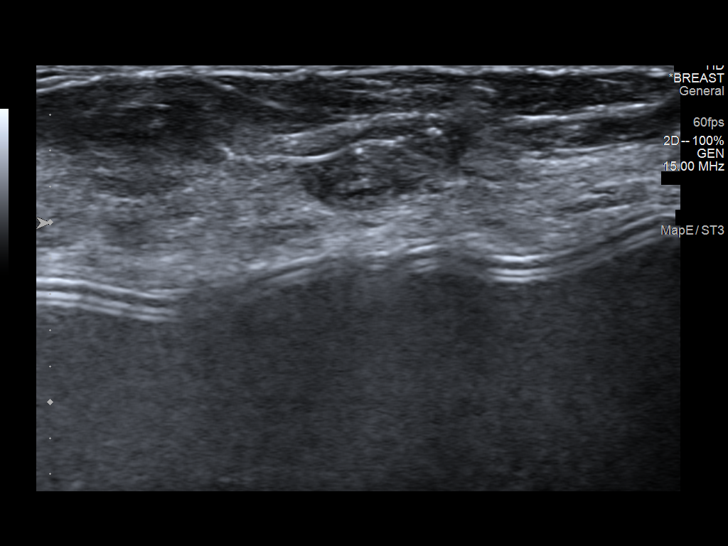
[im 11/15]
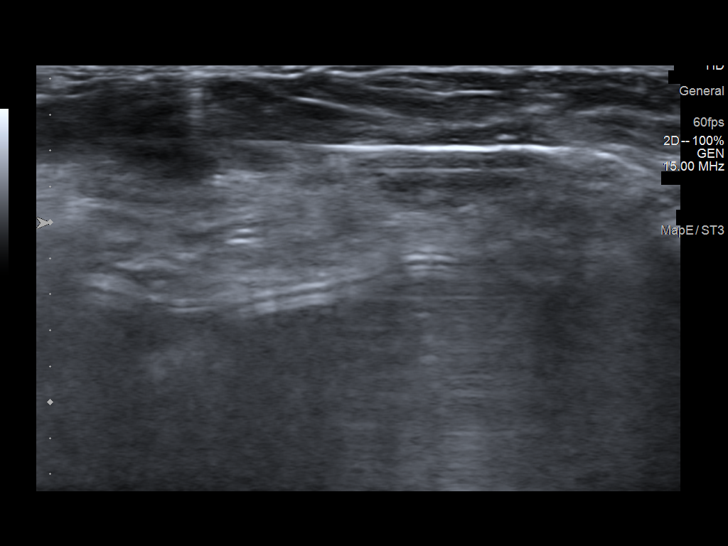
[im 12/15]
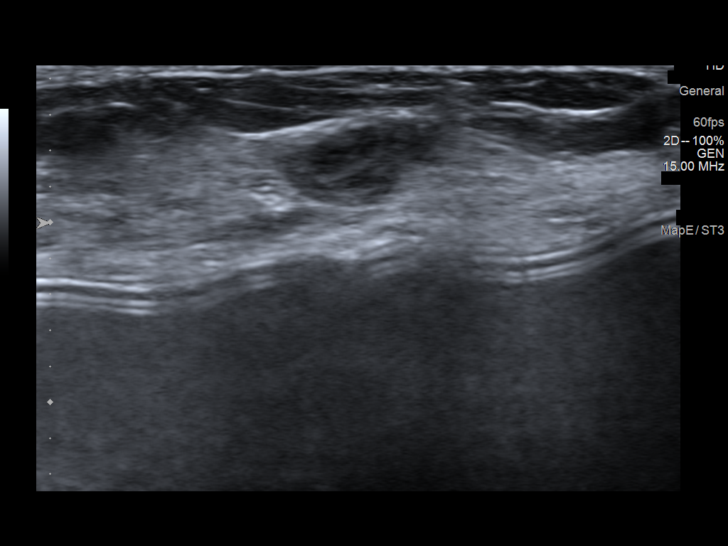
[im 14/15]
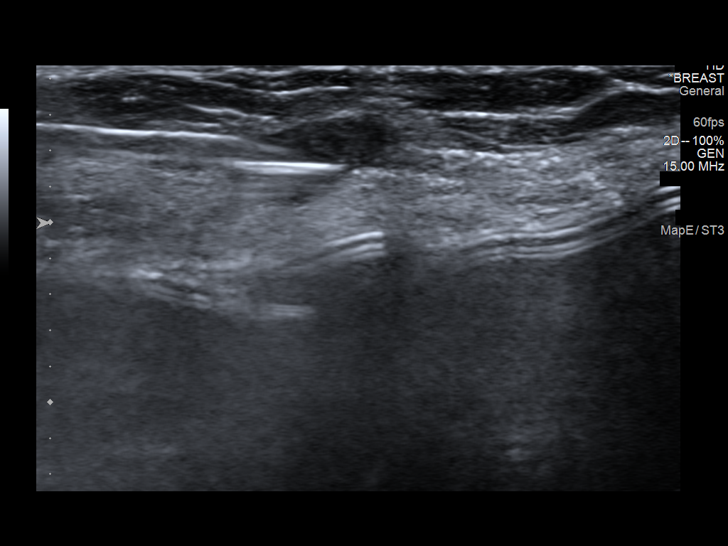
[im 15/15]
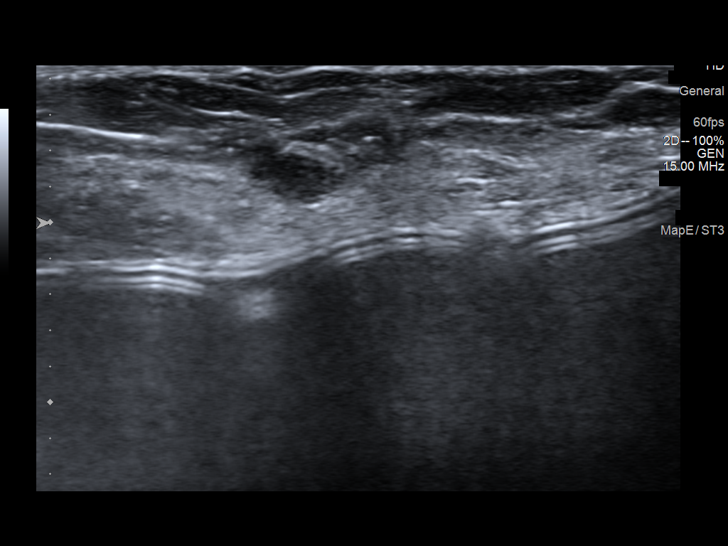

[12 of 15 positions shown; findings below may reference images not displayed]



Lesion quadrant: 4 o'clock right breast mass

Using sterile technique and 1% Lidocaine as local anesthetic, under
direct ultrasound visualization, a 14 gauge Denis Ariel device was
used to perform biopsy of a 4 o'clock right breast mass using a
lateral approach. At the conclusion of the procedure a ribbon shaped
tissue marker clip was deployed into the biopsy cavity. Follow up 2
view mammogram was performed and dictated separately.
IMPRESSION: Ultrasound guided biopsy of a 4 o'clock right breast mass. No
apparent complications.

ADDENDUM:
Pathology revealed BENIGN BREAST TISSUE WITH INCREASED STROMAL
FIBROSIS of the RIGHT breast, 4 o'clock (ribbon clip). This was
found to be concordant by Dr. Chato Stowers.

Pathology results were discussed with the patient by telephone with
Chaouali Sahmimi RN Nurse Navigator. The patient reported doing well
after the biopsy with tenderness at the site. Post biopsy
instructions and care were reviewed and questions were answered. The
patient was encouraged to call [REDACTED] for any additional concerns.

The patient was instructed to return for RIGHT diagnostic
mammography and possible ultrasound in 6 months and informed a
reminder notice would be sent regarding this appointment.

Pathology results reported by Falguni Calvario RN on 02/03/2022.



Lesion quadrant: 4 o'clock right breast mass

Using sterile technique and 1% Lidocaine as local anesthetic, under
direct ultrasound visualization, a 14 gauge Denis Ariel device was
used to perform biopsy of a 4 o'clock right breast mass using a
lateral approach. At the conclusion of the procedure a ribbon shaped
tissue marker clip was deployed into the biopsy cavity. Follow up 2
view mammogram was performed and dictated separately.
IMPRESSION: Ultrasound guided biopsy of a 4 o'clock right breast mass. No
apparent complications.

## 2024-05-10 ENCOUNTER — Other Ambulatory Visit: Payer: Self-pay | Admitting: Obstetrics and Gynecology

## 2024-05-10 DIAGNOSIS — Z1231 Encounter for screening mammogram for malignant neoplasm of breast: Secondary | ICD-10-CM

## 2024-05-15 ENCOUNTER — Ambulatory Visit
Admission: RE | Admit: 2024-05-15 | Discharge: 2024-05-15 | Discharge status: Home / Self Care | Source: Ambulatory Visit | Attending: Obstetrics and Gynecology

## 2024-05-15 DIAGNOSIS — Z1231 Encounter for screening mammogram for malignant neoplasm of breast: Secondary | ICD-10-CM
